# Patient Record
Sex: Female | Born: 1980
Health system: Southern US, Community
[De-identification: ages and names within clinical notes are randomized; demographics above are authoritative.]

## PROBLEM LIST (undated history)

## (undated) DIAGNOSIS — F32A Depression, unspecified: Secondary | ICD-10-CM

## (undated) DIAGNOSIS — F329 Major depressive disorder, single episode, unspecified: Secondary | ICD-10-CM

## (undated) HISTORY — PX: FRACTURE SURGERY: SHX138

## (undated) HISTORY — PX: WISDOM TOOTH EXTRACTION: SHX21

## (undated) HISTORY — DX: Major depressive disorder, single episode, unspecified: F32.9

## (undated) HISTORY — DX: Depression, unspecified: F32.A

---

## 2009-02-03 ENCOUNTER — Inpatient Hospital Stay (HOSPITAL_COMMUNITY): Admission: AD | Admit: 2009-02-03 | Discharge: 2009-02-03 | Payer: Self-pay | Admitting: Obstetrics

## 2009-02-17 ENCOUNTER — Inpatient Hospital Stay (HOSPITAL_COMMUNITY): Admission: AD | Admit: 2009-02-17 | Discharge: 2009-02-19 | Payer: Self-pay | Admitting: Obstetrics

## 2009-12-11 ENCOUNTER — Emergency Department (HOSPITAL_COMMUNITY): Admission: EM | Admit: 2009-12-11 | Discharge: 2009-12-11 | Payer: Self-pay | Admitting: Emergency Medicine

## 2010-08-13 ENCOUNTER — Emergency Department (HOSPITAL_COMMUNITY): Admission: EM | Admit: 2010-08-13 | Discharge: 2010-08-14 | Payer: Self-pay | Admitting: Emergency Medicine

## 2010-08-13 ENCOUNTER — Emergency Department (HOSPITAL_COMMUNITY): Admission: EM | Admit: 2010-08-13 | Discharge: 2010-08-13 | Payer: Self-pay | Admitting: Family Medicine

## 2010-08-17 ENCOUNTER — Emergency Department (HOSPITAL_COMMUNITY): Admission: EM | Admit: 2010-08-17 | Discharge: 2010-08-17 | Payer: Self-pay | Admitting: Family Medicine

## 2010-12-24 NOTE — L&D Delivery Note (Signed)
  Shiffler, MYRKA SYLVA [[id number]]  Delivery Note At 1:28 PM a viable and healthy female was delivered via Vaginal, Spontaneous Delivery (Presentation: ; Occiput Anterior).  APGAR: 9, 9; weight 6 lb 4.9 oz (2860 g).   Placenta status: Intact, Spontaneous.  Cord: 3 vessels  Anesthesia: Epidural  Episiotomy: None Lacerations: None Suture Repair: NA Est. Blood Loss (mL):   Mom to postpartum.  Baby to nursery-stable.  [employer] Rexene Edison 10/25/2011, 1:29 PM     Shiffler, Eulis Foster [[id number]]  Delivery Note At 1:41 PM a viable and healthy female was delivered via Vaginal, Spontaneous Delivery (Presentation: ; Occiput Anterior).  APGAR: 7, 9; weight 5 lb 8.4 oz (2506 g).   Placenta status: Intact, Spontaneous.  Cord: 3 vessels  Anesthesia: Epidural  Episiotomy: None Lacerations: None Suture Repair: NA Est. Blood Loss (mL):   Mom to postpartum.  Baby to nursery-stable.  Siarra Gilkerson H. 10/25/2011, 1:29 PM  Anyi Fels H. 10/25/2011, 1:29 PM

## 2011-04-10 LAB — COMPREHENSIVE METABOLIC PANEL
ALT: 19 U/L (ref 0–35)
AST: 24 U/L (ref 0–37)
Albumin: 2.8 g/dL — ABNORMAL LOW (ref 3.5–5.2)
Alkaline Phosphatase: 116 U/L (ref 39–117)
BUN: 8 mg/dL (ref 6–23)
CO2: 22 mEq/L (ref 19–32)
Calcium: 9 mg/dL (ref 8.4–10.5)
Chloride: 107 mEq/L (ref 96–112)
Creatinine, Ser: 0.47 mg/dL (ref 0.4–1.2)
GFR calc Af Amer: >60 mL/min (ref >60)
GFR calc non Af Amer: >60 mL/min (ref >60)
Glucose, Bld: 89 mg/dL (ref 70–99)
Potassium: 3.8 mEq/L (ref 3.5–5.1)
Sodium: 136 mEq/L (ref 135–145)
Total Bilirubin: 0.3 mg/dL (ref 0.3–1.2)
Total Protein: 5.6 g/dL — ABNORMAL LOW (ref 6.0–8.3)

## 2011-04-10 LAB — CBC
HCT: 34.8 % — ABNORMAL LOW (ref 36.0–46.0)
HCT: 33 % — ABNORMAL LOW (ref 36.0–46.0)
HCT: 35.6 % — ABNORMAL LOW (ref 36.0–46.0)
Hemoglobin: 11.8 g/dL — ABNORMAL LOW (ref 12.0–15.0)
Hemoglobin: 11.1 g/dL — ABNORMAL LOW (ref 12.0–15.0)
Hemoglobin: 11.9 g/dL — ABNORMAL LOW (ref 12.0–15.0)
MCHC: 33.9 g/dL (ref 30.0–36.0)
MCHC: 33.6 g/dL (ref 30.0–36.0)
MCV: 94.8 fL (ref 78.0–100.0)
MCV: 97 fL (ref 78.0–100.0)
Platelets: 174 10*3/uL (ref 150–400)
RBC: 3.66 MIL/uL — ABNORMAL LOW (ref 3.87–5.11)
RBC: 3.4 MIL/uL — ABNORMAL LOW (ref 3.87–5.11)
RBC: 3.71 MIL/uL — ABNORMAL LOW (ref 3.87–5.11)
RDW: 14 % (ref 11.5–15.5)
WBC: 9 10*3/uL (ref 4.0–10.5)
WBC: 14.4 10*3/uL — ABNORMAL HIGH (ref 4.0–10.5)

## 2011-04-10 LAB — URIC ACID: Uric Acid, Serum: 3.9 mg/dL (ref 2.4–7.0)

## 2011-06-11 ENCOUNTER — Institutional Professional Consult (permissible substitution): Payer: Self-pay | Admitting: Pediatrics

## 2011-08-13 LAB — HIV ANTIBODY (ROUTINE TESTING W REFLEX): HIV: NONREACTIVE

## 2011-10-24 ENCOUNTER — Inpatient Hospital Stay (HOSPITAL_COMMUNITY): Payer: Managed Care, Other (non HMO) | Admitting: Anesthesiology

## 2011-10-24 ENCOUNTER — Inpatient Hospital Stay (HOSPITAL_COMMUNITY): Payer: Managed Care, Other (non HMO)

## 2011-10-24 ENCOUNTER — Encounter (HOSPITAL_COMMUNITY): Payer: Self-pay | Admitting: *Deleted

## 2011-10-24 ENCOUNTER — Encounter (HOSPITAL_COMMUNITY): Payer: Self-pay | Admitting: Anesthesiology

## 2011-10-24 ENCOUNTER — Inpatient Hospital Stay (HOSPITAL_COMMUNITY)
Admission: AD | Admit: 2011-10-24 | Discharge: 2011-10-25 | DRG: 775 | Disposition: A | Discharge status: Home / Self Care | Payer: Managed Care, Other (non HMO) | Source: Ambulatory Visit | Attending: Obstetrics & Gynecology | Admitting: Obstetrics & Gynecology

## 2011-10-24 DIAGNOSIS — O30049 Twin pregnancy, dichorionic/diamniotic, unspecified trimester: Secondary | ICD-10-CM

## 2011-10-24 DIAGNOSIS — O30009 Twin pregnancy, unspecified number of placenta and unspecified number of amniotic sacs, unspecified trimester: Principal | ICD-10-CM | POA: Diagnosis present

## 2011-10-24 LAB — SYPHILIS: RPR W/REFLEX TO RPR TITER AND TREPONEMAL ANTIBODIES, TRADITIONAL SCREENING AND DIAGNOSIS ALGORITHM: RPR: NONREACTIVE

## 2011-10-24 LAB — CBC
HCT: 36.8 % (ref 36.0–46.0)
MCH: 29.7 pg (ref 26.0–34.0)
MCHC: 32.9 g/dL (ref 30.0–36.0)
MCV: 90.4 fL (ref 78.0–100.0)
RDW: 16.1 % — ABNORMAL HIGH (ref 11.5–15.5)
WBC: 9 10*3/uL (ref 4.0–10.5)

## 2011-10-24 LAB — ANTIBODY SCREEN: Antibody Screen: NEGATIVE

## 2011-10-24 LAB — RUBELLA ANTIBODY, IGM: Rubella: IMMUNE

## 2011-10-24 LAB — HIV ANTIBODY (ROUTINE TESTING W REFLEX): HIV: NONREACTIVE

## 2011-10-24 LAB — ABO/RH: RH Type: POSITIVE

## 2011-10-24 LAB — STREP B DNA PROBE: GBS: NEGATIVE

## 2011-10-24 MED ORDER — SENNOSIDES-DOCUSATE SODIUM 8.6-50 MG PO TABS
2.0000 | ORAL_TABLET | Freq: Every day | ORAL | Status: DC
  Administered 2011-10-24: 2 via ORAL

## 2011-10-24 MED ORDER — TETANUS-DIPHTH-ACELL PERTUSSIS 5-2.5-18.5 LF-MCG/0.5 IM SUSP: 0.5000 mL | Freq: Once | INTRAMUSCULAR | Status: DC

## 2011-10-24 MED ORDER — DIPHENHYDRAMINE HCL 50 MG/ML IJ SOLN: 12.5000 mg | INTRAMUSCULAR | Status: DC | PRN

## 2011-10-24 MED ORDER — IBUPROFEN 600 MG PO TABS: 600.0000 mg | ORAL_TABLET | Freq: Four times a day (QID) | ORAL | Status: DC

## 2011-10-24 MED ORDER — WITCH HAZEL-GLYCERIN EX PADS: 1.0000 "application " | MEDICATED_PAD | CUTANEOUS | Status: DC | PRN

## 2011-10-24 MED ORDER — LANOLIN HYDROUS EX OINT: TOPICAL_OINTMENT | CUTANEOUS | Status: DC | PRN

## 2011-10-24 MED ORDER — BENZOCAINE-MENTHOL 20-0.5 % EX AERO: 1.0000 "application " | INHALATION_SPRAY | CUTANEOUS | Status: DC | PRN

## 2011-10-24 MED ORDER — FENTANYL 2.5 MCG/ML BUPIVACAINE 1/10 % EPIDURAL INFUSION (WH - ANES)
14.0000 mL/h | INTRAMUSCULAR | Status: DC
  Administered 2011-10-24 (×3): 14 mL/h via EPIDURAL
  Filled 2011-10-24 (×3): qty 60

## 2011-10-24 MED ORDER — ONDANSETRON HCL 4 MG/2ML IJ SOLN: 4.0000 mg | INTRAMUSCULAR | Status: DC | PRN

## 2011-10-24 MED ORDER — ACETAMINOPHEN 325 MG PO TABS: 650.0000 mg | ORAL_TABLET | ORAL | Status: DC | PRN

## 2011-10-24 MED ORDER — TETANUS-DIPHTH-ACELL PERTUSSIS 5-2.5-18.5 LF-MCG/0.5 IM SUSP
0.5000 mL | Freq: Once | INTRAMUSCULAR | Status: AC
  Administered 2011-10-25: 0.5 mL via INTRAMUSCULAR
  Filled 2011-10-24: qty 0.5

## 2011-10-24 MED ORDER — LIDOCAINE HCL (PF) 1 % IJ SOLN
30.0000 mL | INTRAMUSCULAR | Status: DC | PRN
  Filled 2011-10-24 (×2): qty 30

## 2011-10-24 MED ORDER — SENNOSIDES-DOCUSATE SODIUM 8.6-50 MG PO TABS: 2.0000 | ORAL_TABLET | Freq: Every day | ORAL | Status: DC

## 2011-10-24 MED ORDER — DIBUCAINE 1 % RE OINT: 1.0000 "application " | TOPICAL_OINTMENT | RECTAL | Status: DC | PRN

## 2011-10-24 MED ORDER — FLEET ENEMA 7-19 GM/118ML RE ENEM: 1.0000 | ENEMA | RECTAL | Status: DC | PRN

## 2011-10-24 MED ORDER — ONDANSETRON HCL 4 MG PO TABS: 4.0000 mg | ORAL_TABLET | ORAL | Status: DC | PRN

## 2011-10-24 MED ORDER — DIPHENHYDRAMINE HCL 25 MG PO CAPS: 25.0000 mg | ORAL_CAPSULE | Freq: Four times a day (QID) | ORAL | Status: DC | PRN

## 2011-10-24 MED ORDER — OXYCODONE-ACETAMINOPHEN 5-325 MG PO TABS: 1.0000 | ORAL_TABLET | ORAL | Status: DC | PRN

## 2011-10-24 MED ORDER — LACTATED RINGERS IV SOLN
INTRAVENOUS | Status: DC
  Administered 2011-10-24: 04:00:00 via INTRAVENOUS

## 2011-10-24 MED ORDER — ONDANSETRON HCL 4 MG/2ML IJ SOLN: 4.0000 mg | Freq: Four times a day (QID) | INTRAMUSCULAR | Status: DC | PRN

## 2011-10-24 MED ORDER — PRENATAL PLUS 27-1 MG PO TABS
1.0000 | ORAL_TABLET | Freq: Every day | ORAL | Status: DC
  Administered 2011-10-25: 1 via ORAL
  Filled 2011-10-24: qty 1

## 2011-10-24 MED ORDER — OXYTOCIN BOLUS FROM INFUSION
500.0000 mL | Freq: Once | INTRAVENOUS | Status: DC
  Filled 2011-10-24: qty 500

## 2011-10-24 MED ORDER — OXYTOCIN 20 UNITS IN LACTATED RINGERS INFUSION - SIMPLE
1.0000 m[IU]/min | INTRAVENOUS | Status: DC
  Administered 2011-10-24: 2 m[IU]/min via INTRAVENOUS
  Filled 2011-10-24: qty 1000

## 2011-10-24 MED ORDER — PHENYLEPHRINE 40 MCG/ML (10ML) SYRINGE FOR IV PUSH (FOR BLOOD PRESSURE SUPPORT)
80.0000 ug | PREFILLED_SYRINGE | INTRAVENOUS | Status: DC | PRN
  Filled 2011-10-24: qty 5

## 2011-10-24 MED ORDER — IBUPROFEN 600 MG PO TABS
600.0000 mg | ORAL_TABLET | Freq: Four times a day (QID) | ORAL | Status: DC | PRN
  Filled 2011-10-24: qty 1

## 2011-10-24 MED ORDER — PHENYLEPHRINE 40 MCG/ML (10ML) SYRINGE FOR IV PUSH (FOR BLOOD PRESSURE SUPPORT)
80.0000 ug | PREFILLED_SYRINGE | INTRAVENOUS | Status: DC | PRN
  Filled 2011-10-24 (×2): qty 5

## 2011-10-24 MED ORDER — OXYTOCIN 20 UNITS IN LACTATED RINGERS INFUSION - SIMPLE
125.0000 mL/h | Freq: Once | INTRAVENOUS | Status: DC
  Filled 2011-10-24: qty 1000

## 2011-10-24 MED ORDER — OXYCODONE-ACETAMINOPHEN 5-325 MG PO TABS: 2.0000 | ORAL_TABLET | ORAL | Status: DC | PRN

## 2011-10-24 MED ORDER — LIDOCAINE HCL 1.5 % IJ SOLN
INTRAMUSCULAR | Status: DC | PRN
  Administered 2011-10-24: 2 mL via INTRADERMAL
  Administered 2011-10-24 (×2): 5 mL via INTRADERMAL

## 2011-10-24 MED ORDER — PRENATAL PLUS 27-1 MG PO TABS: 1.0000 | ORAL_TABLET | Freq: Every day | ORAL | Status: DC

## 2011-10-24 MED ORDER — ZOLPIDEM TARTRATE 5 MG PO TABS: 5.0000 mg | ORAL_TABLET | Freq: Every evening | ORAL | Status: DC | PRN

## 2011-10-24 MED ORDER — EPHEDRINE 5 MG/ML INJ
10.0000 mg | INTRAVENOUS | Status: AC | PRN
  Administered 2011-10-24 (×2): 10 mg via INTRAVENOUS
  Filled 2011-10-24 (×2): qty 4

## 2011-10-24 MED ORDER — SIMETHICONE 80 MG PO CHEW: 80.0000 mg | CHEWABLE_TABLET | ORAL | Status: DC | PRN

## 2011-10-24 MED ORDER — IBUPROFEN 600 MG PO TABS
600.0000 mg | ORAL_TABLET | Freq: Four times a day (QID) | ORAL | Status: DC
  Administered 2011-10-24 – 2011-10-25 (×4): 600 mg via ORAL
  Filled 2011-10-24 (×4): qty 1

## 2011-10-24 MED ORDER — TERBUTALINE SULFATE 1 MG/ML IJ SOLN: 0.2500 mg | Freq: Once | INTRAMUSCULAR | Status: DC | PRN

## 2011-10-24 MED ORDER — EPHEDRINE 5 MG/ML INJ
10.0000 mg | INTRAVENOUS | Status: DC | PRN
  Filled 2011-10-24: qty 4

## 2011-10-24 MED ORDER — LACTATED RINGERS IV SOLN
500.0000 mL | Freq: Once | INTRAVENOUS | Status: AC
  Administered 2011-10-24: 1000 mL via INTRAVENOUS

## 2011-10-24 MED ORDER — LACTATED RINGERS IV SOLN: 500.0000 mL | INTRAVENOUS | Status: DC | PRN

## 2011-10-24 MED ORDER — CITRIC ACID-SODIUM CITRATE 334-500 MG/5ML PO SOLN: 30.0000 mL | ORAL | Status: DC | PRN

## 2011-10-24 NOTE — H&P (Signed)
30 y.o. G3P2  Estimated Date of Delivery: 11/14/11 admitted at [redacted] weeks gestation with SROM and twin gestation. Prenatal course was uncomplicated.  Patient is a surrogate fertilized with donor embryos. Prenatal labs: Blood Type:A+.  Screening tests for HIV, Syphilis, Hepatitis B, Rubella sensitivity, fetal anomalies, gestational diabetes, and perineal group B strep colonization were negative.    Afebrile, VSS Heart and Lungs: No active disease Abdomen: soft, gravid, EFW AGA twins. Cervical exam:  4-5/80/-2 Grossly ruptured FHT 140-150 x 2 reactive Toco: irregular  Impression: 30 yo G3P2002 gestational surrogate @ 37 weeks with di/di twins SROM Plan: 1) admit  2) presentation vertex/vertex by ultrasound.  PLan for TOL and attempted vaginal delivery.  Will deliver in OR. Pt aware of the possibility of NRFWB of 2nd fetus, compound presentation, cord prolapse, and need for possible breech extraction of 2nd fetus vs emergency cesarean.  R/B/A have been discussed at length.  Pt wishes to proceed with TOL.

## 2011-10-24 NOTE — Progress Notes (Signed)
[place] at bedside for position.

## 2011-10-24 NOTE — Anesthesia Procedure Notes (Signed)
Epidural Patient location during procedure: OB Start time: 10/24/2011 4:16 AM Reason for block: procedure for pain  Staffing Performed by: anesthesiologist   Preanesthetic Checklist Completed: patient identified, site marked, surgical consent, pre-op evaluation, timeout performed, IV checked, risks and benefits discussed and monitors and equipment checked  Epidural Patient position: sitting Prep: site prepped and draped and DuraPrep Patient monitoring: continuous pulse ox and blood pressure Approach: midline Injection technique: LOR air  Needle:  Needle type: Tuohy  Needle gauge: 17 G Needle length: 9 cm Needle insertion depth: 5 cm cm Catheter type: closed end flexible Catheter size: 19 Gauge Catheter at skin depth: 10 cm Test dose: negative  Assessment Events: blood not aspirated, injection not painful, no injection resistance, negative IV test and no paresthesia  Additional Notes Discussed risk of headache, infection, bleeding, nerve injury and failed or incomplete block.  Patient voices understanding and wishes to proceed.

## 2011-10-24 NOTE — Anesthesia Preprocedure Evaluation (Addendum)
Anesthesia Evaluation  Patient identified by MRN, date of birth, ID band Patient awake  General Assessment Comment  Reviewed: Allergy & Precautions, H&P , NPO status , Patient's Chart, lab work & pertinent test results, reviewed documented beta blocker date and time   History of Anesthesia Complications Negative for: history of anesthetic complications  Airway Mallampati: I TM Distance: >3 FB Neck ROM: full    Dental  (+) Teeth Intact   Pulmonary  clear to auscultation        Cardiovascular regular Normal    Neuro/Psych Negative Neurological ROS  Negative Psych ROS   GI/Hepatic negative GI ROS Neg liver ROS    Endo/Other  Negative Endocrine ROS  Renal/GU negative Renal ROS  Genitourinary negative   Musculoskeletal   Abdominal   Peds  Hematology negative hematology ROS (+)   Anesthesia Other Findings   Reproductive/Obstetrics (+) Pregnancy (surrogate - twins vtx/vtx)                          Anesthesia Physical Anesthesia Plan  ASA: II  Anesthesia Plan: Epidural   Post-op Pain Management:    Induction:   Airway Management Planned:   Additional Equipment:   Intra-op Plan:   Post-operative Plan:   Informed Consent: I have reviewed the patients History and Physical, chart, labs and discussed the procedure including the risks, benefits and alternatives for the proposed anesthesia with the patient or authorized representative who has indicated his/her understanding and acceptance.     Plan Discussed with:   Anesthesia Plan Comments:         Anesthesia Quick Evaluation

## 2011-10-24 NOTE — Progress Notes (Signed)
Sw spoke to [employer], Pensions consultant for [employer] [phone]) who advised this Sw, MB director and house coverage  (via phone) that Crystal Gray can visit with the twins and make any decision regarding the twins during this hospitalization.  The biological father is expected to be here on Friday afternoon.  Sw plans to meet with the pt tomorrow and continue working with the couples until the infants are discharged.

## 2011-10-24 NOTE — Progress Notes (Signed)
Dr. Tenny Craw notified of pt presenting with SROM.  Notified of + fern.  Notified of VE and fhr pattern. Order received to admit for labor.  Repeat US for position.  Place epidural.

## 2011-10-24 NOTE — Anesthesia Postprocedure Evaluation (Signed)
Anesthesia Post Note  Patient: Crystal Gray  Procedure(s) Performed: * No procedures listed *  Anesthesia type: Epidural  Patient location: Mother/Baby  Post pain: Pain level controlled  Post assessment: Post-op Vital signs reviewed  Last Vitals:  Filed Vitals:   10/24/11 1451  BP: 132/78  Pulse: 86  Temp:   Resp: 18    Post vital signs: Reviewed  Level of consciousness: awake  Complications: No apparent anesthesia complications

## 2011-10-24 NOTE — Progress Notes (Signed)
Pt G3 P2 at 37wks leaking clear fluid since 0130.  Twins-plans for vag delivery, GBS neg.

## 2011-10-24 NOTE — Progress Notes (Signed)
  S.Pt comfortable after epidural O. Filed Vitals:   10/24/11 0631 10/24/11 0701 10/24/11 0731 10/24/11 0801  BP: 121/77 116/73 96/71 133/82  Pulse: 94 119 128 99  Temp:  98.4 F (36.9 C)    TempSrc:  Oral    Resp: 18 18 18 18   Height:      Weight:      SpO2:       AOX3 NAD Gravid Soft NT Cvx 6-7/80/-2 with bulging bag toco Q 7-10 minutes FHT 140-150 reactive x 2 U/S Vtx/Vtx  A/P 1) 37 week vtx/vtx twin gestation SROM 2) Arom of forebag for clear fluid, Question if Baby B was actually bag that Srom'd 3) Cont epidural 4) Pitocin for augmentation 5) plan for vaginal delivery in OR 6) FWB reassuring

## 2011-10-24 NOTE — Progress Notes (Signed)
Notified of twins with breech presentation for baby b.  Notified of Dr. Tenny Craw specialing  Pt.  Pt may go to room 170.

## 2011-10-25 ENCOUNTER — Encounter (HOSPITAL_COMMUNITY): Payer: Self-pay | Admitting: Obstetrics and Gynecology

## 2011-10-25 LAB — CBC
HCT: 33.5 % — ABNORMAL LOW (ref 36.0–46.0)
Hemoglobin: 10.8 g/dL — ABNORMAL LOW (ref 12.0–15.0)
MCV: 92.3 fL (ref 78.0–100.0)
RBC: 3.63 MIL/uL — ABNORMAL LOW (ref 3.87–5.11)
RDW: 16.5 % — ABNORMAL HIGH (ref 11.5–15.5)
WBC: 9.8 10*3/uL (ref 4.0–10.5)

## 2011-10-25 MED ORDER — HYDROCODONE-ACETAMINOPHEN 5-500 MG PO TABS: 1.0000 | ORAL_TABLET | ORAL | Status: AC | PRN

## 2011-10-25 MED ORDER — ZOLPIDEM TARTRATE 10 MG PO TABS: 10.0000 mg | ORAL_TABLET | Freq: Every evening | ORAL | Status: DC | PRN

## 2011-10-25 MED ORDER — IBUPROFEN 600 MG PO TABS: 600.0000 mg | ORAL_TABLET | Freq: Four times a day (QID) | ORAL | Status: AC | PRN

## 2011-10-25 NOTE — Progress Notes (Signed)
UR Chart review completed.  

## 2011-10-25 NOTE — Addendum Note (Signed)
Addendum  created 10/25/11 0741 by Doreene Burke   Modules edited:Charges VN, Notes Section

## 2011-10-25 NOTE — Anesthesia Postprocedure Evaluation (Signed)
Anesthesia Post Note  Patient: Crystal Gray  Procedure(s) Performed: * No procedures listed *  Anesthesia type: Epidural  Patient location: Mother/Baby  Post pain: Pain level controlled  Post assessment: Post-op Vital signs reviewed  Last Vitals:  Filed Vitals:   10/25/11 0521  BP: 119/72  Pulse: 72  Temp: 36.4 C  Resp: 17    Post vital signs: Reviewed  Level of consciousness: awake  Complications: No apparent anesthesia complications

## 2011-10-25 NOTE — Progress Notes (Signed)
PPD#1 Pt without c/o.  Lochia- mild. Waiting to parents to arrive. May want to go home this PM. IMP/ Doing well Plan/ routine care.

## 2011-10-25 NOTE — Discharge Summary (Signed)
Obstetric Discharge Summary Reason for Admission: onset of labor Prenatal Procedures: ultrasound Intrapartum Procedures: spontaneous vaginal delivery Postpartum Procedures: none Complications-Operative and Postpartum: none Hemoglobin  Date Value Range Status  10/25/2011 10.8* 12.0-15.0 (g/dL) Final     HCT  Date Value Range Status  10/25/2011 33.5* 36.0-46.0 (%) Final    Discharge Diagnoses: Term pregnancy with spontaneous vagianl delivery of twins.  Gestational Surrogate  Discharge Information: Date: 10/25/2011 Activity: pelvic rest Diet: routine Medications: Ibuprofen and Vicodin Condition: improved Instructions: refer to practice specific booklet Discharge to: home Follow-up Information    Follow up with Sabrinna Yearwood H. in 4 weeks. (Follow-up in 4-6 weeks for your post-partum visit)    Contact information:   [address] [place] [address] [phone]          Newborn Data:   Kysha, Muralles [[id number]]  Live born female  Birth Weight: 6 lb 4.9 oz (2860 g) APGAR: 9, 9   Shiffler, KHALEESI GRUEL [[id number]]  Live born female  Birth Weight: 5 lb 8.4 oz (2506 g) APGAR: 7, 9  Home with In newborn nursery waiting for arrival of the biological fathers.  Laniece Hornbaker H. 10/25/2011, 1:32 PM

## 2011-10-25 NOTE — Progress Notes (Signed)
SW contacted Beth Cox/Assistant to Robert Carter/hospital attorney to discuss surrogate situation.  SW faxed her the birth order.  She called back and stated that she would have Mr. Carter review it first thing in the morning and get back to SW. 

## 2011-10-25 NOTE — Progress Notes (Signed)
Robert Carter/hospital attorney contact SW to say that the birth order only addresses the father and we have no documentation stating that the mother is a surrogate and therefore must treat her as the mother of the babies.  He states the hospital must discharge the babies to her if the biological father is not here by discharge.  He states if there is concern by the family he will contact their attorney.   

## 2011-10-26 ENCOUNTER — Encounter (HOSPITAL_COMMUNITY): Payer: Self-pay | Admitting: *Deleted

## 2011-10-26 NOTE — Progress Notes (Signed)
Pt discharged home at 2330.

## 2012-05-07 ENCOUNTER — Ambulatory Visit (INDEPENDENT_AMBULATORY_CARE_PROVIDER_SITE_OTHER): Payer: Managed Care, Other (non HMO) | Admitting: Sports Medicine

## 2012-05-07 VITALS — BP 124/83 | Ht 68.0 in | Wt 145.0 lb

## 2012-05-07 DIAGNOSIS — M84369A Stress fracture, unspecified tibia and fibula, initial encounter for fracture: Secondary | ICD-10-CM | POA: Insufficient documentation

## 2012-05-07 NOTE — Progress Notes (Signed)
  Subjective:    Patient ID: Crystal Gray, female    DOB: [birth year], 31 y.o.   MRN: [id number]  HPI This is a 31 year old new patient presenting with left tibial pain since January 2013.  She gave birth to twins the end of last year and started running again in December. She reports weighing about 10-15 pounds more in December than her baseline weight prior to her pregnancy. In January, during a run, she had a sharp pain in her tibia. She had been running about 12-15 miles a week, her usual distance prior to her pregnancy.  The pain is persistent despite taking a break from running for about 3 months. She presents today due to the persistence of the pain. She has been using ibuprofen infrequently. The pain is worse with weight bearing activity and running is still very painful to her.   Review of Systems    Objective:   Physical Exam Gen: NAD, appears well-nourished, fit Psych: alert and oriented, engaged, appropriate to all questions Pulm: NI WOB MSK:    Hip:     Inspection: normal     Palpation: non-tender     Sensation: intact     Strength: 4/5 abduction of left hip compared to 5/5 on right; patient also feels her left hip is weaker with flexion compared to left but this is not overtly noticeable on PE     ROM: intact   Leg:     Inspection: normal     Palpation: mild TTP left upper tibia     Sensation: intact     Strength: intact knee and foot strength     ROM: intact knee and foot ROM     Maneuvers: positive hop test Neuro:   Gait: favors right side; limps and puts only partial weight on left  Ultrasound Fracture of upper tibia that is healing but showing surface irregularity.  This is marked by hard appearing callus  On long view this is more prominent only on left On transverse view it is scalloped and irregular increased doppler actiivity    Assessment & Plan:

## 2012-05-07 NOTE — Assessment & Plan Note (Addendum)
New problem. Stress fracture of upper tibia that is healing but showing irregular scarring. Air-cast.  Given rehabilitative regimen, which includes avoiding running for next 2 weeks.  Follow-up in 2 weeks for re-evaluation of fracture.  OK to walk or xtrain in air cast We will begin tibial protocol on RTC  Note with higher third of medial tibia this could be slower healing

## 2012-05-27 ENCOUNTER — Ambulatory Visit: Payer: Managed Care, Other (non HMO) | Admitting: Sports Medicine

## 2012-05-27 ENCOUNTER — Ambulatory Visit (INDEPENDENT_AMBULATORY_CARE_PROVIDER_SITE_OTHER): Payer: Managed Care, Other (non HMO) | Admitting: Sports Medicine

## 2012-05-27 VITALS — BP 116/79

## 2012-05-27 DIAGNOSIS — M84369A Stress fracture, unspecified tibia and fibula, initial encounter for fracture: Secondary | ICD-10-CM

## 2012-05-27 NOTE — Progress Notes (Signed)
  Subjective:    Patient ID: Quintella Reichert, female    DOB: 03/18/1981, 31 y.o.   MRN: [id number]  HPI Left Tibial  Fracture followup: Has been using aircast x 2 weeks- without problem.  Has been doing hip exercises as directed for the past 2 weeks.  No pain in area of fracture.  Has not been doing an exercise outside of regular activities of daily living.  No redness or swelling or pain of left lower leg.   No pain with walking or standing during the day Review of Systems As per above.     Objective:   Physical Exam  Constitutional: She appears well-developed and well-nourished.  Cardiovascular: Normal rate.   Pulmonary/Chest: Effort normal. No respiratory distress.  Musculoskeletal:       Left lower leg exam: No redness or swelling of leg or joint.  Normal sensation throughout.  Normal 5/5 strength in lower leg bilateral.   + nickle sized area of callous formation palpable in left medial superior tibial area.  No pain with palpation of this area.    She has a negative hop test She has normal walking gait  MSK ultrasound Compared to prior ultrasound there is increased bone remodeling of the area of callous This looks to be about 95% healed with hard callus and only a very slight increase in Doppler activity       Assessment & Plan:

## 2012-05-27 NOTE — Assessment & Plan Note (Signed)
90% hard callous formation, 10% soft callous formation on u/s.  Healing very well.   Pt to continue to use air cast with activity.  Pt to do return to exercise plan as outlined by Dr. Darrick Penna.  If no problems with pain as pt advances via exercise plan- then pt doesn't need to return.  Pt to return for recheck if any discomfort.

## 2013-01-27 ENCOUNTER — Other Ambulatory Visit: Payer: Self-pay | Admitting: Allergy and Immunology

## 2013-07-12 ENCOUNTER — Encounter (HOSPITAL_COMMUNITY): Payer: Self-pay

## 2013-07-12 ENCOUNTER — Emergency Department (HOSPITAL_COMMUNITY): Payer: Managed Care, Other (non HMO)

## 2013-07-12 ENCOUNTER — Observation Stay (HOSPITAL_COMMUNITY)
Admission: EM | Admit: 2013-07-12 | Discharge: 2013-07-14 | Disposition: A | Discharge status: Home / Self Care | Payer: Managed Care, Other (non HMO) | Attending: Orthopedic Surgery | Admitting: Orthopedic Surgery

## 2013-07-12 DIAGNOSIS — W19XXXA Unspecified fall, initial encounter: Secondary | ICD-10-CM | POA: Insufficient documentation

## 2013-07-12 DIAGNOSIS — S82209A Unspecified fracture of shaft of unspecified tibia, initial encounter for closed fracture: Principal | ICD-10-CM | POA: Insufficient documentation

## 2013-07-12 DIAGNOSIS — Y9351 Activity, roller skating (inline) and skateboarding: Secondary | ICD-10-CM | POA: Insufficient documentation

## 2013-07-12 DIAGNOSIS — S82202A Unspecified fracture of shaft of left tibia, initial encounter for closed fracture: Secondary | ICD-10-CM

## 2013-07-12 DIAGNOSIS — S82409A Unspecified fracture of shaft of unspecified fibula, initial encounter for closed fracture: Principal | ICD-10-CM | POA: Insufficient documentation

## 2013-07-12 MED ORDER — MORPHINE SULFATE 4 MG/ML IJ SOLN
4.0000 mg | Freq: Once | INTRAMUSCULAR | Status: AC
  Administered 2013-07-12: 4 mg via INTRAVENOUS
  Filled 2013-07-12: qty 1

## 2013-07-12 NOTE — ED Provider Notes (Signed)
History    CSN: [id number] Arrival date & time 07/12/13  1928  First MD Initiated Contact with Patient 07/12/13 1943     Chief Complaint  Patient presents with  . Leg Injury   (Consider location/radiation/quality/duration/timing/severity/associated sxs/prior Treatment) Patient is a 32 y.o. female presenting with leg pain.  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   Impact surface:  Athletic surface Leg location:  L lower leg Pain details:    Quality:  Sharp   Severity:  Severe   Onset quality:  Sudden   Timing:  Constant Relieved by: IV fentanyl given by EMS. Exacerbated by: movement. Associated symptoms: no numbness and no tingling    Past Medical History  Diagnosis Date  . No pertinent past medical history    Past Surgical History  Procedure Laterality Date  . Wisdom tooth extraction     No family history on file. History  Substance Use Topics  . Smoking status: Never Smoker   . Smokeless tobacco: Not on file  . Alcohol Use: No   OB History   Grav Para Term Preterm Abortions TAB SAB Ect Mult Living   4 3 3  0 0 0 0 0 1 4     Review of Systems  Neurological: Negative for weakness and numbness.  All other systems reviewed and are negative.    Allergies  Review of patient's allergies indicates no known allergies.  Home Medications   Current Outpatient Rx  Name  Route  Sig  Dispense  Refill  . norgestimate-ethinyl estradiol (ORTHO-CYCLEN,SPRINTEC,PREVIFEM) 0.25-35 MG-MCG tablet   Oral   Take 1 tablet by mouth daily.         Marland Kitchen zolpidem (AMBIEN) 10 MG tablet   Oral   Take 10 mg by mouth at bedtime as needed.            BP 131/80  Temp(Src) 98.1 F (36.7 C) (Oral)  Resp 18  SpO2 100%  LMP 06/29/2013 Physical Exam  Nursing note and vitals reviewed. Constitutional: She is oriented to person, place, and time. She appears well-developed  and well-nourished. No distress.  HENT:  Head: Normocephalic and atraumatic. Head is without raccoon's eyes and without Battle's sign.  Nose: Nose normal.  Mouth/Throat: Oropharynx is clear and moist.  Eyes: Conjunctivae and EOM are normal. Pupils are equal, round, and reactive to light. No scleral icterus.  Neck: Neck supple. No spinous process tenderness and no muscular tenderness present.  Cardiovascular: Normal rate, regular rhythm, normal heart sounds and intact distal pulses.   No murmur heard. Pulmonary/Chest: Effort normal and breath sounds normal. No stridor. No respiratory distress. She has no rales. She exhibits no tenderness.  Abdominal: Soft. Bowel sounds are normal. She exhibits no distension. There is no tenderness. There is no rebound and no guarding.  Musculoskeletal: Normal range of motion. She exhibits no edema and no tenderness.       Thoracic back: She exhibits no tenderness and no bony tenderness.       Lumbar back: She exhibits no tenderness and no bony tenderness.       Left lower leg: She exhibits deformity ( 2+ distal pulses, normal sensation and perfusion. motor intact. ). She exhibits no laceration.  No evidence of trauma to extremities, except as noted.  2+ distal pulses.    Neurological: She is alert and oriented to person,  place, and time.  Skin: Skin is warm and dry. No rash noted.  Psychiatric: She has a normal mood and affect. Her behavior is normal.    ED Course  Procedures (including critical care time) Labs Reviewed - No data to display Dg Tibia/fibula Left  07/12/2013   *RADIOLOGY REPORT*  Clinical Data: Lower leg injury.  LEFT TIBIA AND FIBULA - 2 VIEW  Comparison: None.  Findings: A comminuted fracture of the distal tibia and fibula is displaced laterally one shaft width.  The ankle and knee joints are located.  IMPRESSION: Comminuted distal tibia and fibula fracture with lateral displacement as described.   Original Report Authenticated By:  Marin Roberts, M.D.  All radiology studies independently viewed by me.    1. Fracture tibia/fibula, left, closed, initial encounter     MDM  32 yo female with obvious deformity to left lower leg.  Not open.  No other injuries identified on history or exam.  IV morphine for pain.  Plain films pending.    Confirm Tib/fib fracture.  Ortho consulted and has admitted for surgical fixation.  IV morphine for pain with transient improvement.    Candyce Churn, MD 07/13/13 [phone]

## 2013-07-12 NOTE — Consult Note (Signed)
Reason for Consult:left leg pain, deformity Referring Physician: [facility]  Crystal Gray is an 32 y.o. female who fell while playing roller derby earlier today. She can not relate the specifics of the injury, but did fall and was unable to bear weight and noted obvious deformity of left leg. Pain was severe, but has decreased. Denies any other substantial pain. Pain is a throbbing sensation. Pain is located at the left lower leg.   Past Medical History  Diagnosis Date  . No pertinent past medical history     Past Surgical History  Procedure Laterality Date  . Wisdom tooth extraction      No family history on file.  Social History:  reports that she has never smoked. She does not have any smokeless tobacco history on file. She reports that she does not drink alcohol or use illicit drugs.  Allergies: No Known Allergies  Medications: I have reviewed the patient's current medications.  No results found for this or any previous visit (from the past 48 hour(s)).  Dg Tibia/fibula Left  07/12/2013   *RADIOLOGY REPORT*  Clinical Data: Lower leg injury.  LEFT TIBIA AND FIBULA - 2 VIEW  Comparison: None.  Findings: A comminuted fracture of the distal tibia and fibula is displaced laterally one shaft width.  The ankle and knee joints are located.  IMPRESSION: Comminuted distal tibia and fibula fracture with lateral displacement as described.   Original Report Authenticated By: Marin Roberts, M.D.    Review of Systems  Constitutional: Negative.   HENT: Negative.   Eyes: Negative.   Respiratory: Negative.   Cardiovascular: Negative.   Gastrointestinal: Negative.   Genitourinary: Negative.   Skin: Negative.   All other systems reviewed and are negative.   Blood pressure 126/74, pulse 85, temperature 98.1 F (36.7 C), temperature source Oral, resp. rate 18, last menstrual period 06/29/2013, SpO2 99.00%. Physical Exam  Constitutional: She appears well-developed and  well-nourished.  HENT:  Head: Normocephalic.  Eyes: Pupils are equal, round, and reactive to light.  Neck: Normal range of motion.  Cardiovascular: Intact distal pulses.   Respiratory: Effort normal.  GI: Soft.  Neurological: She is alert.  Skin: Skin is warm and dry.   Patient is noted to have obvious external rotation deformity of left lower leg.  + DP pulses, + sensation distally Very slight abrasion over left leg + FHL, EHL, with pain  Assessment/Plan: Left tibia fracture  I discussed patient with Dr. Turner Daniels. Plan is to admit to the floor in a post mold splint.  Patient will be made NPO after midnight tonight, and plan to to proceed to OR tomorrow for operative fixation of the fracture with an IM nail. Procedure and risks, etc. discussed with patient. Dr. Turner Daniels will discuss in more detail with patient tomorrow.     Vikrant Pryce LEONARD 07/12/2013, 11:37 PM

## 2013-07-12 NOTE — Progress Notes (Signed)
Orthopedic Tech Progress Note Patient Details:  Crystal Gray 11-Nov-1981 [id number]  Ortho Devices Type of Ortho Device: Ace wrap;Post (short leg) splint Ortho Device/Splint Location: left leg Ortho Device/Splint Interventions: Application   Jusitn Salsgiver 07/12/2013, 10:45 PM

## 2013-07-12 NOTE — ED Notes (Signed)
Dr. Yevette Edwards called to verify that patient can eat, due to surgery being in the am.

## 2013-07-12 NOTE — ED Notes (Signed)
Paged ortho to apply left post mold splint.

## 2013-07-12 NOTE — ED Notes (Signed)
Patient presents to ED via [facility]. Patient was playing roller derby and fell. Notable deformity to left leg. No open fracture noted. Positive pulses, motor and sensory. Denies hitting head. Denies LOC.

## 2013-07-12 NOTE — ED Notes (Signed)
Patient in xray at this time.

## 2013-07-13 ENCOUNTER — Encounter (HOSPITAL_COMMUNITY): Payer: Self-pay | Admitting: Anesthesiology

## 2013-07-13 ENCOUNTER — Encounter (HOSPITAL_COMMUNITY)
Admission: EM | Disposition: A | Discharge status: Home / Self Care | Payer: Self-pay | Source: Home / Self Care | Attending: Orthopedic Surgery

## 2013-07-13 ENCOUNTER — Inpatient Hospital Stay (HOSPITAL_COMMUNITY): Payer: Managed Care, Other (non HMO) | Admitting: Anesthesiology

## 2013-07-13 ENCOUNTER — Inpatient Hospital Stay (HOSPITAL_COMMUNITY): Payer: Managed Care, Other (non HMO)

## 2013-07-13 DIAGNOSIS — S82202A Unspecified fracture of shaft of left tibia, initial encounter for closed fracture: Secondary | ICD-10-CM

## 2013-07-13 HISTORY — PX: TIBIA IM NAIL INSERTION: SHX2516

## 2013-07-13 LAB — URINALYSIS, ROUTINE W REFLEX MICROSCOPIC
Hgb urine dipstick: NEGATIVE
Leukocytes, UA: NEGATIVE
Nitrite: NEGATIVE
Specific Gravity, Urine: 1.012 (ref 1.005–1.030)
Urobilinogen, UA: 0.2 mg/dL (ref 0.0–1.0)

## 2013-07-13 LAB — CBC
HCT: 35.5 % — ABNORMAL LOW (ref 36.0–46.0)
Hemoglobin: 11.8 g/dL — ABNORMAL LOW (ref 12.0–15.0)
MCHC: 33.2 g/dL (ref 30.0–36.0)

## 2013-07-13 LAB — CREATININE, SERUM
GFR calc Af Amer: >90 mL/min (ref >90)
GFR calc non Af Amer: >90 mL/min (ref >90)

## 2013-07-13 SURGERY — INSERTION, INTRAMEDULLARY ROD, TIBIA
Anesthesia: General | Site: Leg Lower | Laterality: Left | Wound class: Clean

## 2013-07-13 MED ORDER — METHOCARBAMOL 500 MG PO TABS
500.0000 mg | ORAL_TABLET | Freq: Four times a day (QID) | ORAL | Status: DC | PRN
  Administered 2013-07-13 – 2013-07-14 (×2): 500 mg via ORAL
  Filled 2013-07-13 (×2): qty 1

## 2013-07-13 MED ORDER — ONDANSETRON HCL 4 MG/2ML IJ SOLN: 4.0000 mg | Freq: Four times a day (QID) | INTRAMUSCULAR | Status: DC | PRN

## 2013-07-13 MED ORDER — KCL IN DEXTROSE-NACL 20-5-0.45 MEQ/L-%-% IV SOLN
INTRAVENOUS | Status: DC
  Filled 2013-07-13 (×4): qty 1000

## 2013-07-13 MED ORDER — METHOCARBAMOL 100 MG/ML IJ SOLN
500.0000 mg | Freq: Four times a day (QID) | INTRAVENOUS | Status: DC | PRN
  Filled 2013-07-13: qty 5

## 2013-07-13 MED ORDER — ACETAMINOPHEN 325 MG PO TABS: 650.0000 mg | ORAL_TABLET | ORAL | Status: DC | PRN

## 2013-07-13 MED ORDER — BUPIVACAINE HCL (PF) 0.25 % IJ SOLN
INTRAMUSCULAR | Status: AC
  Filled 2013-07-13: qty 30

## 2013-07-13 MED ORDER — PROMETHAZINE HCL 25 MG/ML IJ SOLN: 6.2500 mg | INTRAMUSCULAR | Status: DC | PRN

## 2013-07-13 MED ORDER — HYDROCODONE-ACETAMINOPHEN 5-325 MG PO TABS: 1.0000 | ORAL_TABLET | ORAL | Status: DC | PRN

## 2013-07-13 MED ORDER — ONDANSETRON HCL 4 MG PO TABS: 4.0000 mg | ORAL_TABLET | Freq: Four times a day (QID) | ORAL | Status: DC | PRN

## 2013-07-13 MED ORDER — ZOLPIDEM TARTRATE 5 MG PO TABS: 5.0000 mg | ORAL_TABLET | Freq: Every evening | ORAL | Status: DC | PRN

## 2013-07-13 MED ORDER — METOCLOPRAMIDE HCL 5 MG PO TABS
5.0000 mg | ORAL_TABLET | Freq: Three times a day (TID) | ORAL | Status: DC | PRN
  Filled 2013-07-13: qty 2

## 2013-07-13 MED ORDER — DIPHENHYDRAMINE HCL 12.5 MG/5ML PO ELIX: 12.5000 mg | ORAL_SOLUTION | ORAL | Status: DC | PRN

## 2013-07-13 MED ORDER — DIAZEPAM 5 MG PO TABS
5.0000 mg | ORAL_TABLET | Freq: Four times a day (QID) | ORAL | Status: DC | PRN
  Administered 2013-07-13: 5 mg via ORAL
  Filled 2013-07-13: qty 1

## 2013-07-13 MED ORDER — NALOXONE HCL 0.4 MG/ML IJ SOLN: 0.4000 mg | INTRAMUSCULAR | Status: DC | PRN

## 2013-07-13 MED ORDER — ENOXAPARIN SODIUM 30 MG/0.3ML ~~LOC~~ SOLN
30.0000 mg | SUBCUTANEOUS | Status: DC
  Administered 2013-07-14: 30 mg via SUBCUTANEOUS
  Filled 2013-07-13 (×2): qty 0.3

## 2013-07-13 MED ORDER — HYDROMORPHONE HCL PF 1 MG/ML IJ SOLN
INTRAMUSCULAR | Status: AC
  Filled 2013-07-13: qty 1

## 2013-07-13 MED ORDER — SODIUM CHLORIDE 0.9 % IV SOLN
250.0000 mL | INTRAVENOUS | Status: DC
  Administered 2013-07-13 (×2): 250 mL via INTRAVENOUS

## 2013-07-13 MED ORDER — MORPHINE SULFATE (PF) 1 MG/ML IV SOLN
INTRAVENOUS | Status: DC
  Administered 2013-07-13: 3 mg via INTRAVENOUS
  Administered 2013-07-13: 6 mg via INTRAVENOUS
  Administered 2013-07-13: 02:00:00 via INTRAVENOUS
  Filled 2013-07-13: qty 25

## 2013-07-13 MED ORDER — MORPHINE SULFATE 4 MG/ML IJ SOLN
4.0000 mg | Freq: Once | INTRAMUSCULAR | Status: AC
  Administered 2013-07-13: 4 mg via INTRAVENOUS
  Filled 2013-07-13: qty 1

## 2013-07-13 MED ORDER — SODIUM CHLORIDE 0.9 % IJ SOLN: 9.0000 mL | INTRAMUSCULAR | Status: DC | PRN

## 2013-07-13 MED ORDER — 0.9 % SODIUM CHLORIDE (POUR BTL) OPTIME
TOPICAL | Status: DC | PRN
  Administered 2013-07-13: 1000 mL

## 2013-07-13 MED ORDER — DIPHENHYDRAMINE HCL 50 MG/ML IJ SOLN: 12.5000 mg | Freq: Four times a day (QID) | INTRAMUSCULAR | Status: DC | PRN

## 2013-07-13 MED ORDER — ALUM & MAG HYDROXIDE-SIMETH 200-200-20 MG/5ML PO SUSP: 30.0000 mL | Freq: Four times a day (QID) | ORAL | Status: DC | PRN

## 2013-07-13 MED ORDER — METOCLOPRAMIDE HCL 5 MG/ML IJ SOLN: 5.0000 mg | Freq: Three times a day (TID) | INTRAMUSCULAR | Status: DC | PRN

## 2013-07-13 MED ORDER — HYDROMORPHONE HCL PF 1 MG/ML IJ SOLN
0.2500 mg | INTRAMUSCULAR | Status: DC | PRN
  Administered 2013-07-13 (×2): 0.5 mg via INTRAVENOUS

## 2013-07-13 MED ORDER — OXYCODONE HCL 5 MG PO TABS: 5.0000 mg | ORAL_TABLET | Freq: Once | ORAL | Status: DC | PRN

## 2013-07-13 MED ORDER — HYDROMORPHONE HCL PF 1 MG/ML IJ SOLN
0.5000 mg | INTRAMUSCULAR | Status: DC | PRN
  Administered 2013-07-13: 1 mg via INTRAVENOUS
  Administered 2013-07-13: 0.5 mg via INTRAVENOUS
  Filled 2013-07-13 (×3): qty 1

## 2013-07-13 MED ORDER — CEFAZOLIN SODIUM-DEXTROSE 2-3 GM-% IV SOLR
INTRAVENOUS | Status: AC
  Filled 2013-07-13: qty 50

## 2013-07-13 MED ORDER — ACETAMINOPHEN 650 MG RE SUPP: 650.0000 mg | RECTAL | Status: DC | PRN

## 2013-07-13 MED ORDER — BUPIVACAINE HCL (PF) 0.25 % IJ SOLN
INTRAMUSCULAR | Status: DC | PRN
  Administered 2013-07-13: 10 mL

## 2013-07-13 MED ORDER — OXYCODONE-ACETAMINOPHEN 5-325 MG PO TABS: 1.0000 | ORAL_TABLET | ORAL | Status: DC | PRN

## 2013-07-13 MED ORDER — SODIUM CHLORIDE 0.9 % IJ SOLN: 3.0000 mL | INTRAMUSCULAR | Status: DC | PRN

## 2013-07-13 MED ORDER — SODIUM CHLORIDE 0.9 % IJ SOLN: 3.0000 mL | Freq: Two times a day (BID) | INTRAMUSCULAR | Status: DC

## 2013-07-13 MED ORDER — ONDANSETRON HCL 4 MG/2ML IJ SOLN: 4.0000 mg | INTRAMUSCULAR | Status: DC | PRN

## 2013-07-13 MED ORDER — MORPHINE SULFATE 2 MG/ML IJ SOLN: 1.0000 mg | INTRAMUSCULAR | Status: DC | PRN

## 2013-07-13 MED ORDER — OXYCODONE HCL 5 MG/5ML PO SOLN: 5.0000 mg | Freq: Once | ORAL | Status: DC | PRN

## 2013-07-13 MED ORDER — HYDROMORPHONE HCL PF 1 MG/ML IJ SOLN
INTRAMUSCULAR | Status: AC
  Administered 2013-07-13: 0.5 mg via INTRAVENOUS
  Filled 2013-07-13: qty 1

## 2013-07-13 MED ORDER — OXYCODONE-ACETAMINOPHEN 5-325 MG PO TABS
1.0000 | ORAL_TABLET | ORAL | Status: DC | PRN
  Administered 2013-07-13 – 2013-07-14 (×3): 2 via ORAL
  Filled 2013-07-13 (×3): qty 2

## 2013-07-13 MED ORDER — DIPHENHYDRAMINE HCL 12.5 MG/5ML PO ELIX: 12.5000 mg | ORAL_SOLUTION | Freq: Four times a day (QID) | ORAL | Status: DC | PRN

## 2013-07-13 MED ORDER — DOCUSATE SODIUM 100 MG PO CAPS
100.0000 mg | ORAL_CAPSULE | Freq: Two times a day (BID) | ORAL | Status: DC
  Administered 2013-07-14: 100 mg via ORAL
  Filled 2013-07-13 (×3): qty 1

## 2013-07-13 SURGICAL SUPPLY — 64 items
BANDAGE ADHESIVE 1X3 (GAUZE/BANDAGES/DRESSINGS) ×4 IMPLANT
BANDAGE ELASTIC 6 VELCRO ST LF (GAUZE/BANDAGES/DRESSINGS) ×4 IMPLANT
BANDAGE GAUZE ELAST BULKY 4 IN (GAUZE/BANDAGES/DRESSINGS) ×2 IMPLANT
BIT DRILL 3.8X6 NS (BIT) ×2 IMPLANT
BIT DRILL 4.4 NS (BIT) ×2 IMPLANT
BLADE SURG 15 STRL LF DISP TIS (BLADE) IMPLANT
BLADE SURG 15 STRL SS (BLADE)
BLADE SURG ROTATE 9660 (MISCELLANEOUS) IMPLANT
CLOTH BEACON ORANGE TIMEOUT ST (SAFETY) ×2 IMPLANT
COVER SURGICAL LIGHT HANDLE (MISCELLANEOUS) ×2 IMPLANT
CUFF TOURNIQUET SINGLE 34IN LL (TOURNIQUET CUFF) ×2 IMPLANT
CUFF TOURNIQUET SINGLE 44IN (TOURNIQUET CUFF) IMPLANT
DRAPE C-ARM 42X72 X-RAY (DRAPES) ×2 IMPLANT
DRAPE C-ARMOR (DRAPES) ×2 IMPLANT
DRAPE ORTHO SPLIT 77X108 STRL (DRAPES) ×3
DRAPE PROXIMA HALF (DRAPES) ×2 IMPLANT
DRAPE SURG ORHT 6 SPLT 77X108 (DRAPES) ×3 IMPLANT
DRSG ADAPTIC 3X8 NADH LF (GAUZE/BANDAGES/DRESSINGS) IMPLANT
DURAPREP 26ML APPLICATOR (WOUND CARE) ×2 IMPLANT
ELECT REM PT RETURN 9FT ADLT (ELECTROSURGICAL) ×2
ELECTRODE REM PT RTRN 9FT ADLT (ELECTROSURGICAL) ×1 IMPLANT
GAUZE XEROFORM 1X8 LF (GAUZE/BANDAGES/DRESSINGS) ×2 IMPLANT
GLOVE BIO SURGEON STRL SZ7 (GLOVE) ×2 IMPLANT
GLOVE BIO SURGEON STRL SZ7.5 (GLOVE) ×8 IMPLANT
GLOVE BIO SURGEON STRL SZ8 (GLOVE) ×2 IMPLANT
GLOVE BIO SURGEON STRL SZ8.5 (GLOVE) ×2 IMPLANT
GLOVE BIOGEL PI IND STRL 6.5 (GLOVE) ×1 IMPLANT
GLOVE BIOGEL PI IND STRL 7.0 (GLOVE) ×1 IMPLANT
GLOVE BIOGEL PI IND STRL 8 (GLOVE) ×2 IMPLANT
GLOVE BIOGEL PI INDICATOR 6.5 (GLOVE) ×1
GLOVE BIOGEL PI INDICATOR 7.0 (GLOVE) ×1
GLOVE BIOGEL PI INDICATOR 8 (GLOVE) ×2
GLOVE ECLIPSE 6.5 STRL STRAW (GLOVE) ×2 IMPLANT
GLOVE NEODERM STER SZ 7 (GLOVE) ×4 IMPLANT
GOWN SRG XL XLNG 56XLVL 4 (GOWN DISPOSABLE) ×2 IMPLANT
GOWN STRL NON-REIN LRG LVL3 (GOWN DISPOSABLE) ×6 IMPLANT
GOWN STRL NON-REIN XL XLG LVL4 (GOWN DISPOSABLE) ×2
GUIDEWIRE BALL NOSE 80CM (WIRE) ×2 IMPLANT
KIT BASIN OR (CUSTOM PROCEDURE TRAY) ×2 IMPLANT
KIT ROOM TURNOVER OR (KITS) ×2 IMPLANT
MANIFOLD NEPTUNE II (INSTRUMENTS) ×2 IMPLANT
NAIL TIBIA 8X33CM (Nail) ×2 IMPLANT
NEEDLE 22X1 1/2 (OR ONLY) (NEEDLE) ×2 IMPLANT
NS IRRIG 1000ML POUR BTL (IV SOLUTION) ×2 IMPLANT
PACK GENERAL/GYN (CUSTOM PROCEDURE TRAY) ×2 IMPLANT
PAD ARMBOARD 7.5X6 YLW CONV (MISCELLANEOUS) ×4 IMPLANT
PADDING CAST COTTON 6X4 STRL (CAST SUPPLIES) IMPLANT
PIN GUIDE ACE (PIN) ×2 IMPLANT
SCREW ACECAP 32MM (Screw) ×2 IMPLANT
SCREW ACECAP 36MM (Screw) ×2 IMPLANT
SCREW PROXIMAL DEPUY (Screw) ×2 IMPLANT
SCREW PRXML FT 40X5.5XNS LF (Screw) ×1 IMPLANT
SCREW PRXML FT 45X5.5XLCK NS (Screw) ×1 IMPLANT
SPONGE GAUZE 4X4 12PLY (GAUZE/BANDAGES/DRESSINGS) ×2 IMPLANT
STAPLER VISISTAT 35W (STAPLE) IMPLANT
STOCKINETTE 6  STRL (DRAPES)
STOCKINETTE 6 STRL (DRAPES) IMPLANT
SUT ETHILON 3 0 PS 1 (SUTURE) ×4 IMPLANT
SUT VIC AB 0 CTB1 27 (SUTURE) ×2 IMPLANT
SUT VIC AB 2-0 CTB1 (SUTURE) ×2 IMPLANT
SYR 20ML ECCENTRIC (SYRINGE) ×2 IMPLANT
TOWEL OR 17X24 6PK STRL BLUE (TOWEL DISPOSABLE) ×2 IMPLANT
TOWEL OR 17X26 10 PK STRL BLUE (TOWEL DISPOSABLE) ×2 IMPLANT
WATER STERILE IRR 1000ML POUR (IV SOLUTION) ×2 IMPLANT

## 2013-07-13 NOTE — Anesthesia Preprocedure Evaluation (Addendum)
Anesthesia Evaluation  Patient identified by MRN, date of birth, ID band  Reviewed: Allergy & Precautions, H&P , NPO status , Patient's Chart, lab work & pertinent test results  History of Anesthesia Complications (+) PONV  Airway Mallampati: I TM Distance: >3 FB Neck ROM: full    Dental  (+) Teeth Intact and Dental Advidsory Given   Pulmonary neg pulmonary ROS,          Cardiovascular negative cardio ROS      Neuro/Psych negative neurological ROS     GI/Hepatic negative GI ROS, Neg liver ROS,   Endo/Other  negative endocrine ROS  Renal/GU negative Renal ROS     Musculoskeletal negative musculoskeletal ROS (+)   Abdominal   Peds  Hematology negative hematology ROS (+)   Anesthesia Other Findings   Reproductive/Obstetrics                          Anesthesia Physical Anesthesia Plan  ASA: I  Anesthesia Plan: General   Post-op Pain Management:    Induction: Intravenous  Airway Management Planned: LMA  Additional Equipment:   Intra-op Plan:   Post-operative Plan:   Informed Consent:   Dental Advisory Given  Plan Discussed with: CRNA, Surgeon and Anesthesiologist  Anesthesia Plan Comments:        Anesthesia Quick Evaluation

## 2013-07-13 NOTE — Op Note (Signed)
Pre Op Dx: Left distal tibia and fibula fractures at the junction of middle and distal thirds closed  Post Op Dx: Same  Procedure: Open reduction internal fixation left tibia fracture using 8 mm x 33 cm Biomet tibial nail 2 proximal locking screws and 2 distal locking screws  Surgeon: Nestor Lewandowsky, MD  Assistant: Tomi Likens. Vear Clock PA-C  Anesthesia: General  EBL: Minimal  Fluids: 1200 cc crystalloid  Tourniquet Time: None  Indications: 32 year old who was engaged in the roller derby yesterday went down and sustained closed spiral left distal tib-fib fractures at the junction of middle and distal thirds. Was admitted by my partner Dr. Yevette Edwards for operative intervention today. Plan is for intramedullary rodding of the tibia to stabilize the fracture decrease pain and increase potential of healing. Risks and benefits of surgery discussed with the patient.  Procedure: Patient identified by arm band and taken to operating room for the appropriate anesthetic monitors were attached and general endotracheal anesthesia induced with the patient in the supine position on the flat [place] table. She did receive preoperative IV antibiotics. Tourniquet was applied to the distal left thigh but never used. Left lower Charney prepped and draped in usual sterile fashion from the toes to the tourniquet. Timeout procedure performed. The knee was then placed over a radiolucent triangle and a medial parapatellar incision from the tip of the patella to the shoulder of the tibia was made through the skin and subcutaneous tissue. Using a #10 blade we then split the fascia between the patellar tendon and retinaculum medially and entered the proximal tibia with a Biomet all. This is followed by the guidewire which was then threaded through the diaphysis of the tibia across the fracture site and into the center of the ankle. C-arm imaging was used to confirm position of the guidewire which was then overreamed with a  proximal reamer and a flexible reamer starting out at 8 mm and going to 9.5 mm. She had a very tight isthmus. We then measured for a 33 cm x 8 mm tibial nail which was loaded on the inserter and driven over the guidewire C-arm imaging was used to confirm good position of the nail which was then locked proximally with medial and lateral oblique screws and then distally using the perfect circle technique with the C-arm. Distally we used an anterior to posterior screw and a medial to lateral screw. Final C-arm images were taken confirming good position of the nail and screws. The stab wounds for the screw insertion and the main incision were irrigated with normal saline solution. The medial parapatellar wound was closed with running 2-0 Vicryl suture and the stab wounds closed with 3-0 nylon suture. Dressing of Xerofoam 4 x 4 dressing sponges web roll and a six-inch Ace wrap was applied. The patient was awakened extubated and taken to the recovery without difficulty.

## 2013-07-13 NOTE — Progress Notes (Signed)
ANTICOAGULATION CONSULT NOTE - Initial Consult  Pharmacy Consult for Coumadin Indication: VTE prophylaxis  No Known Allergies  Vital Signs: Temp: 98.5 F (36.9 C) (07/21 1531) Temp src: Oral (07/21 0657) BP: 143/84 mmHg (07/21 1531) Pulse Rate: 80 (07/21 1531)  Labs: No results found for this basename: HGB, HCT, PLT, APTT, LABPROT, INR, HEPARINUNFRC, CREATININE, CKTOTAL, CKMB, TROPONINI,  in the last 72 hours  Estimated Creatinine Clearance: 102.8 ml/min (by C-G formula based on Cr of 0.47).   Medical History: Past Medical History  Diagnosis Date  . No pertinent past medical history     Assessment: 32 year old female in a recreational roller derby accident with left tibia and fibula fracture.  S/p repair 7/21, now beginning Coumadin for VTE prophylaxis  Goal of Therapy:  INR 2-3 Monitor platelets by anticoagulation protocol: Yes   Plan:  1) Coumadin 5 mg po daily at 1800 pm 2) Daily INR 3) Coumadin education  Thank you. Okey Regal, PharmD [phone]  07/13/2013,4:01 PM

## 2013-07-13 NOTE — Progress Notes (Signed)
UR COMPLETED  

## 2013-07-13 NOTE — Anesthesia Postprocedure Evaluation (Signed)
Anesthesia Post Note  Patient: Crystal Gray  Procedure(s) Performed: Procedure(s) (LRB): INTRAMEDULLARY (IM) NAIL TIBIAL (Left)  Anesthesia type: general  Patient location: PACU  Post pain: Pain level controlled  Post assessment: Patient's Cardiovascular Status Stable  Last Vitals:  Filed Vitals:   07/13/13 1500  BP:   Pulse: 63  Temp:   Resp: 11    Post vital signs: Reviewed and stable  Level of consciousness: sedated  Complications: No apparent anesthesia complications

## 2013-07-13 NOTE — Transfer of Care (Signed)
Immediate Anesthesia Transfer of Care Note  Patient: Pension scheme manager  Procedure(s) Performed: Procedure(s): INTRAMEDULLARY (IM) NAIL TIBIAL (Left)  Patient Location: PACU  Anesthesia Type:General  Level of Consciousness: awake, alert  and oriented  Airway & Oxygen Therapy: Patient Spontanous Breathing and Patient connected to nasal cannula oxygen  Post-op Assessment: Report given to PACU RN, Post -op Vital signs reviewed and stable and Patient moving all extremities X 4  Post vital signs: Reviewed and stable  Complications: No apparent anesthesia complications

## 2013-07-13 NOTE — H&P (Signed)
Crystal Gray is an 32 y.o. female.   Chief Complaint: Left tibia and fibula fracture at the junction of the middle and distal thirds closed mildly comminuted HPI: Patient was participating in recreational league roller derby was knocked down during the event and sustained a closed spiral mildly comminuted and displaced left distal tib-fib fracture the junction of middle and distal third. 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 She was admitted last evening by my partner Dr. Yevette Edwards, and I will be assuming her care today for surgical stabilization of the fractures. She denies any loss of consciousness or injuries to other extremities.  Past Medical History  Diagnosis Date  . No pertinent past medical history     Past Surgical History  Procedure Laterality Date  . Wisdom tooth extraction      No family history on file. Social History:  reports that she has never smoked. She does not have any smokeless tobacco history on file. She reports that she does not drink alcohol or use illicit drugs.  Allergies: No Known Allergies  Medications Prior to Admission  Medication Sig Dispense Refill  . levonorgestrel (MIRENA) 20 MCG/24HR IUD 1 each by Intrauterine route once.        No results found for this or any previous visit (from the past 48 hour(s)). Dg Tibia/fibula Left  07/12/2013   *RADIOLOGY REPORT*  Clinical Data: Lower leg injury.  LEFT TIBIA AND FIBULA - 2 VIEW  Comparison: None.  Findings: A comminuted fracture of the distal tibia and fibula is displaced laterally one shaft width.  The ankle and knee joints are located.  IMPRESSION: Comminuted distal tibia and fibula fracture with lateral displacement as described.   Original Report Authenticated By: Marin Roberts, M.D.    ROS she denies any shortness of breath, chest pains, unusual problems with bleeding in the past and has had no trouble with anesthesia in the  past during the delivery of her children.  Blood pressure 135/72, pulse 85, temperature 97.9 F (36.6 C), temperature source Oral, resp. rate 16, height 5\' 8"  (1.727 m), weight 68.493 kg (151 lb), last menstrual period 06/29/2013, SpO2 98.00%. Physical Exam Well-nourished well-developed 32 year old woman in no obvious distress normal breathing pattern. The left lower trembly is in a posterior splint, she moves her toes without difficulty and has normal sensation to all of her toes. The ER physician and Dr. Conley Canal both reported this to be a closed fracture I did not take the dressing down. Full range of motion of both knees hips shoulders elbows and wrists no cuts scrapes or abrasions to the skin.  Assessment/Plan Assessment: Closed mildly comminuted spiral distal tib-fib fracture at the junction of the middle and distal thirds shortened 2 cm and externally rotated 60.  Plan: Stabilization with a locked intramedullary rod was discussed at length with the patient. This will restore both a length and rotational stability to the fracture as well is allowing early motion of the knee and ankle. Risks and benefits of surgery including nerve vessel tendon damage and infection as well as blood clots were discussed at length with the patient as well as anesthesia risks. We will attempt to get this done today to decrease pain increase function.  [name] 07/13/2013, 7:14 AM

## 2013-07-13 NOTE — Progress Notes (Signed)
At 1000 pt unable to void, urine specimen needed for pregnancy test.  Dr. Turner Daniels said to go ahead and do I&O catheter.  1000 ml of clear, yellow urine emptied from bladder.  Area around urethra had milky, dark, thick discharge.

## 2013-07-13 NOTE — Preoperative (Signed)
Beta Blockers   Reason not to administer Beta Blockers:Not Applicable 

## 2013-07-13 NOTE — Progress Notes (Signed)
At 1645 pt unable to void, bladder scan showed greater than 999 in bladder.  Crystal Burn, PA notified order received to do I&O cath.  1000 ml of clear yellow urine emptied from bladder.  Area around urethra had a thick, milky, light brown discharge.

## 2013-07-13 NOTE — Interval H&P Note (Signed)
History and Physical Interval Note:  07/13/2013 11:09 AM  Crystal Gray  has presented today for surgery, with the diagnosis of left tib/fib fracture  The various methods of treatment have been discussed with the patient and family. After consideration of risks, benefits and other options for treatment, the patient has consented to  Procedure(s): INTRAMEDULLARY (IM) NAIL TIBIAL (Left) as a surgical intervention .  The patient's history has been reviewed, patient examined, no change in status, stable for surgery.  I have reviewed the patient's chart and labs.  Questions were answered to the patient's satisfaction.     Nestor Lewandowsky

## 2013-07-14 ENCOUNTER — Encounter (HOSPITAL_COMMUNITY): Payer: Self-pay | Admitting: Orthopedic Surgery

## 2013-07-14 LAB — URINE CULTURE

## 2013-07-14 LAB — PROTIME-INR
INR: 1.22 (ref 0.00–1.49)
Prothrombin Time: 15.1 seconds (ref 11.6–15.2)

## 2013-07-14 MED ORDER — OXYCODONE-ACETAMINOPHEN 5-325 MG PO TABS: 1.0000 | ORAL_TABLET | ORAL | Status: DC | PRN

## 2013-07-14 MED ORDER — ASPIRIN EC 325 MG PO TBEC: 325.0000 mg | DELAYED_RELEASE_TABLET | Freq: Two times a day (BID) | ORAL | Status: DC

## 2013-07-14 NOTE — Discharge Summary (Signed)
Patient ID: Crystal Gray MRN: [id number] DOB/AGE: 1981/03/05 32 y.o.  Admit date: 07/12/2013 Discharge date: 07/14/2013  Admission Diagnoses:  Principal Problem:   Left tibia and fibula fracture at the junction of middle and distal thirds.   Discharge Diagnoses:  Same  Past Medical History  Diagnosis Date  . No pertinent past medical history     Surgeries: Procedure(s): INTRAMEDULLARY (IM) NAIL TIBIAL on 07/12/2013 - 07/13/2013   Consultants: Treatment Team:  Nestor Lewandowsky, MD  Discharged Condition: Improved  Hospital Course: Crystal Gray is an 32 y.o. female who was admitted 07/12/2013 for operative treatment ofLeft tibial fracture. Patient has severe unremitting pain that affects sleep, daily activities, and work/hobbies. After pre-op clearance the patient was taken to the operating room on 07/12/2013 - 07/13/2013 and underwent  Procedure(s): INTRAMEDULLARY (IM) NAIL TIBIAL.    Patient was given perioperative antibiotics: Anti-infectives   Start     Dose/Rate Route Frequency Ordered Stop   07/13/13 1059  ceFAZolin (ANCEF) 2-3 GM-% IVPB SOLR    Comments:  MARTINELLI, JOHN: cabinet override      07/13/13 1059 07/13/13 2259       Patient was given sequential compression devices, early ambulation, and chemoprophylaxis to prevent DVT.  Patient benefited maximally from hospital stay and there were no complications.    Recent vital signs: Patient Vitals for the past 24 hrs:  BP Temp Temp src Pulse Resp SpO2  07/14/13 0613 136/79 mmHg 98.4 F (36.9 C) Oral 89 18 100 %  07/13/13 2147 153/82 mmHg 97.6 F (36.4 C) - 80 14 100 %  07/13/13 1531 143/84 mmHg 98.5 F (36.9 C) - 80 12 97 %  07/13/13 1515 - 97.6 F (36.4 C) - - - -  07/13/13 1509 - - - 92 - 96 %  07/13/13 1507 121/74 mmHg - - - - -  07/13/13 1500 - - - 63 11 100 %  07/13/13 1452 130/82 mmHg - - 79 15 100 %  07/13/13 1450 - - - 71 18 100 %  07/13/13 1445 - - - 58 16 100 %  07/13/13 1437 134/79 mmHg - -  67 13 100 %  07/13/13 1435 - - - 82 14 100 %  07/13/13 1430 - - - 58 16 100 %  07/13/13 1422 132/79 mmHg - - 64 11 100 %  07/13/13 1415 - - - 91 18 100 %  07/13/13 1407 141/87 mmHg - - 79 15 100 %  07/13/13 1400 - - - 75 14 100 %  07/13/13 1352 141/81 mmHg - - 82 15 100 %  07/13/13 1345 - - - 78 13 100 %  07/13/13 1337 138/86 mmHg - - 77 13 100 %  07/13/13 1330 - - - 80 13 100 %  07/13/13 1322 - - - 93 12 100 %  07/13/13 1315 134/87 mmHg 98.6 F (37 C) - 88 14 100 %  07/13/13 0900 134/71 mmHg 98.4 F (36.9 C) - 91 14 -  07/13/13 0819 - - - - 11 98 %     Recent laboratory studies:  Recent Labs  07/13/13 1650  WBC 7.4  HGB 11.8*  HCT 35.5*  PLT 160  CREATININE 0.75     Discharge Medications:     Medication List         aspirin EC 325 MG tablet  Take 1 tablet (325 mg total) by mouth 2 (two) times daily.     levonorgestrel 20 MCG/24HR IUD  Commonly known as:  MIRENA  1 each by Intrauterine route once.     oxyCODONE-acetaminophen 5-325 MG per tablet  Commonly known as:  PERCOCET/ROXICET  Take 1-2 tablets by mouth every 4 (four) hours as needed.        Diagnostic Studies: Dg Tibia/fibula Left  07/13/2013   *RADIOLOGY REPORT*  Clinical Data: ORIF of the left tibia.  Left tibial nail placement.  DG C-ARM 1-60 MIN,LEFT TIBIA AND FIBULA - 2 VIEW  Technique: Five intraoperative fluoroscopic spot films.  Comparison:  07/12/2013.  Findings:  Fluoroscopy time:  2 minutes 10 seconds.  Images demonstrate placement of an antegrade tibial nail with proximal and distal interlocking screws.  Distal tibia and fibular fractures noted.  IMPRESSION: Antegrade left tibial nail placement.   Original Report Authenticated By: Andreas Newport, M.D.   Dg Tibia/fibula Left  07/12/2013   *RADIOLOGY REPORT*  Clinical Data: Lower leg injury.  LEFT TIBIA AND FIBULA - 2 VIEW  Comparison: None.  Findings: A comminuted fracture of the distal tibia and fibula is displaced laterally one shaft width.   The ankle and knee joints are located.  IMPRESSION: Comminuted distal tibia and fibula fracture with lateral displacement as described.   Original Report Authenticated By: Marin Roberts, M.D.   Dg C-arm 1-60 Min  07/13/2013   *RADIOLOGY REPORT*  Clinical Data: ORIF of the left tibia.  Left tibial nail placement.  DG C-ARM 1-60 MIN,LEFT TIBIA AND FIBULA - 2 VIEW  Technique: Five intraoperative fluoroscopic spot films.  Comparison:  07/12/2013.  Findings:  Fluoroscopy time:  2 minutes 10 seconds.  Images demonstrate placement of an antegrade tibial nail with proximal and distal interlocking screws.  Distal tibia and fibular fractures noted.  IMPRESSION: Antegrade left tibial nail placement.   Original Report Authenticated By: Andreas Newport, M.D.    Disposition: 01-Home or Self Care      Discharge Orders   Future Orders Complete By Expires     Call MD / Call 911  As directed     Comments:      If you experience chest pain or shortness of breath, CALL 911 and be transported to the hospital emergency room.  If you develope a fever above 101 F, pus (white drainage) or increased drainage or redness at the wound, or calf pain, call your surgeon's office.    Change dressing  As directed     Comments:      Change dressing on POD #5.  You may clean the incision with alcohol prior to redressing.    Do not put a pillow under the knee. Place it under the heel.  As directed     Driving restrictions  As directed     Comments:      No driving for 2 weeks    Increase activity slowly as tolerated  As directed     Patient may shower  As directed     Comments:      You may shower without a dressing once there is no drainage.  Do not wash over the wound.  If drainage remains, cover wound with plastic wrap and then shower.       Follow-up Information   Follow up with Nestor Lewandowsky, MD In 7 days.   Contact information:   [address] [place] [address] [phone]         Signed: Nestor Lewandowsky 07/14/2013, 7:40 AM

## 2013-07-14 NOTE — Progress Notes (Signed)
Patient has continued to void through out this shift of clear, yellow urine in the [facility].

## 2013-07-14 NOTE — Progress Notes (Signed)
D/C instructions ans scripts given to Pt. Husband at bedside to D/C Pt.

## 2013-07-14 NOTE — Progress Notes (Signed)
Orthopedic Tech Progress Note Patient Details:  Crystal Gray 1981-10-15 [id number]  Ortho Devices Type of Ortho Device: Crutches Ortho Device/Splint Location: left leg Ortho Device/Splint Interventions: Adjustment   Cammer, Mickie Bail 07/14/2013, 11:38 AM

## 2013-07-14 NOTE — Evaluation (Signed)
Physical Therapy Evaluation Patient Details Name: Crystal Gray MRN: [id number] DOB: 06-21-1981 Today's Date: 07/14/2013 Time: 4742-5956 PT Time Calculation (min): 39 min  PT Assessment / Plan / Recommendation History of Present Illness   32 y.o. female who was admitted 07/12/2013 Left tibial fracture and underwent Procedure(s): INTRAMEDULLARY (IM) NAIL TIBIAL   Clinical Impression  Pt presents at supervision to modified independent with functional mobility tasks.  Uses crutches safely and maintains PWB <20 lbs consistently (preferentially NWB).  Able to ascend/descend stairs safely to access home.  Has appropriate set up and only needs crutches for home (pt is 5'8").  Educated on positioning and use of ice to operated limb for pain management and to promote healing.  Educated on care for UE's due to use of crutches and on stretching for left hip/low back to prevent mechanical issues due to PWB status.  Pt demonstrates and verbalize full understanding.  Recommend OPPT for progressive rehabilitation once cleared by MD, no other needs at this time.  RN verbally updated.    PT Assessment  All further PT needs can be met in the next venue of care    Follow Up Recommendations  Outpatient PT (at discretion of pt and MD)    Does the patient have the potential to tolerate intense rehabilitation      Barriers to Discharge        Equipment Recommendations  Crutches    Recommendations for Other Services     Frequency      Precautions / Restrictions Precautions Required Braces or Orthoses: Other Brace/Splint (post op splint to operated leg) Restrictions Weight Bearing Restrictions: Yes LLE Weight Bearing: Partial weight bearing LLE Partial Weight Bearing Percentage or Pounds: 20 lb   Pertinent Vitals/Pain Min to moderated along tibial shaft; ice and elevation following assessment.      Mobility  Bed Mobility Bed Mobility: Supine to Sit;Sit to Supine Supine to Sit: 6: Modified  independent (Device/Increase time) Sit to Supine: 6: Modified independent (Device/Increase time) Transfers Transfers: Sit to Stand;Stand to Sit Sit to Stand: 5: Supervision;From bed;With upper extremity assist Stand to Sit: 5: Supervision;With upper extremity assist;To bed Details for Transfer Assistance: initial instructional cues for safest technique with crutches to prevent >20 lb WB on operated leg Ambulation/Gait Ambulation/Gait Assistance: 5: Supervision Ambulation Distance (Feet): 160 Feet Assistive device: Crutches Ambulation/Gait Assistance Details: initially instructional cues re: how to maintain PWB and crutch walking technique Gait velocity: unmeasured General Gait Details: able to perform crutch walking and maintain NWB over level surfaces with minimal fatigue or increased pain.   Stairs: Yes Stairs Assistance: 5: Supervision Stairs Assistance Details (indicate cue type and reason): demostrational cues for fwd and sideways with rail and with crutches; handout used too. Stair Management Technique: One rail Right;One rail Left;Forwards;Sideways;With crutches Number of Stairs: 3    Exercises     PT Diagnosis: Difficulty walking;Acute pain  PT Problem List: Decreased range of motion;Decreased mobility;Pain PT Treatment Interventions:       PT Goals(Current goals can be found in the care plan section) Acute Rehab PT Goals Patient Stated Goal: return to fully independent PT Goal Formulation: With patient  Visit Information  Last PT Received On: 07/14/13 Assistance Needed: +1       Prior Functioning  Home Living Family/patient expects to be discharged to:: Private residence Living Arrangements: Spouse/significant other;Children Available Help at Discharge: Family;Available PRN/intermittently Type of Home: House Home Access: Stairs to enter [employer] of Steps: 5 Entrance Stairs-Rails: Can reach both Home  Layout: Two level;Able to live on main level  with bedroom/bathroom Home Equipment: None Prior Function Level of Independence: Independent Communication Communication: No difficulties    Cognition  Cognition Arousal/Alertness: Awake/alert Behavior During Therapy: WFL for tasks assessed/performed Overall Cognitive Status: Within Functional Limits for tasks assessed    Extremity/Trunk Assessment Upper Extremity Assessment Upper Extremity Assessment: Overall WFL for tasks assessed Lower Extremity Assessment Lower Extremity Assessment: LLE deficits/detail LLE: Unable to fully assess due to pain;Unable to fully assess due to immobilization   Balance    End of Session PT - End of Session Equipment Utilized During Treatment: Gait belt Activity Tolerance: Patient tolerated treatment well;Patient limited by fatigue Patient left: in bed;with call bell/phone within reach Nurse Communication: Mobility status  GP Functional Assessment Tool Used: clinical judgement Functional Limitation: Mobility: Walking and moving around Mobility: Walking and Moving Around Current Status (W0981): At least 60 percent but less than 80 percent impaired, limited or restricted Mobility: Walking and Moving Around Goal Status [phone]): At least 1 percent but less than 20 percent impaired, limited or restricted   Dennis Bast 07/14/2013, 10:25 AM

## 2013-07-14 NOTE — Progress Notes (Signed)
Patient starting voiding on her own on [facility].  Will continue to monitor.

## 2014-10-25 ENCOUNTER — Encounter (HOSPITAL_COMMUNITY): Payer: Self-pay | Admitting: Orthopedic Surgery

## 2016-09-19 ENCOUNTER — Ambulatory Visit (INDEPENDENT_AMBULATORY_CARE_PROVIDER_SITE_OTHER): Payer: 59 | Admitting: Family Medicine

## 2016-09-19 VITALS — BP 100/66 | HR 68 | Temp 98.7°F | Resp 18 | Ht 68.0 in | Wt 150.0 lb

## 2016-09-19 DIAGNOSIS — J020 Streptococcal pharyngitis: Secondary | ICD-10-CM

## 2016-09-19 LAB — POCT RAPID STREP A (OFFICE): RAPID STREP A SCREEN: POSITIVE — AB

## 2016-09-19 MED ORDER — AMOXICILLIN 500 MG PO CAPS: 1000.0000 mg | ORAL_CAPSULE | Freq: Two times a day (BID) | ORAL | 0 refills | Status: DC

## 2016-09-19 NOTE — Progress Notes (Signed)
By signing my name below, I, Crystal Gray, attest that this documentation has been prepared under the direction and in the presence of Crystal SimmerKristi Shirlyn Savin, MD.  Electronically Signed: Arvilla MarketMesha Gray, Medical Scribe. 09/19/2016. 6:05 PM.  Subjective:    Patient ID: Crystal Gray, female    DOB: 1981/05/03, 35 y.o.   MRN: [id number]  09/19/2016  Sore Throat (MONDAY)   HPI  HPI Comments: Crystal ReichertChristina Gray is a 35 y.o. female who presents to the [facility] complaining of sore throat onset 2 days ago. Pt fever last night, swollen lymph nodes, and HA yesterday. Pt has taken Tylenol without relief to her symptoms. 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 Pt denies sick contacts, ear pain, nasal congestion, coughing, nausea, emesis, abdominal pain, and rash.  Review of Systems  Constitutional: Positive for fever.  HENT: Positive for sore throat. Negative for congestion and ear pain.   Respiratory: Negative for cough.   Gastrointestinal: Negative for abdominal pain, nausea and vomiting.  Skin: Negative for rash.  Neurological: Positive for headaches.   Past Medical History:  Diagnosis Date  . Depression   . No pertinent past medical history    Past Surgical History:  Procedure Laterality Date  . FRACTURE SURGERY    . TIBIA IM NAIL INSERTION Left 07/13/2013   Procedure: INTRAMEDULLARY (IM) NAIL TIBIAL;  Surgeon: Crystal LewandowskyFrank J Rowan, MD;  Location: [facility] OR;  Service: Orthopedics;  Laterality: Left;  . WISDOM TOOTH EXTRACTION     No Known Allergies  Social History   Social History  . Marital status: Married    Spouse name: N/A  . Number of children: N/A  . Years of education: N/A   Occupational History  . Not on file.   Social History Main Topics  . Smoking status: Never Smoker  . Smokeless tobacco: Never Used  . Alcohol use No  . Drug use: No  . Sexual activity: Not on file   Other Topics Concern  . Not on file   Social History Narrative  . No narrative on  file   Family History  Problem Relation Age of Onset  . Cancer Maternal Grandfather   . Heart disease Maternal Grandfather        Objective:    BP 100/66 (BP Location: Right Arm, Patient Position: Sitting, Cuff Size: Small)   Pulse 68   Temp 98.7 F (37.1 C) (Oral)   Resp 18   Ht 5\' 8"  (1.727 m)   Wt 150 lb (68 kg)   SpO2 100%   BMI 22.81 kg/m  Physical Exam  Constitutional: She is oriented to person, place, and time. She appears well-developed and well-nourished. No distress.  HENT:  Head: Normocephalic and atraumatic.  Right Ear: Tympanic membrane, external ear and ear canal normal.  Left Ear: Tympanic membrane, external ear and ear canal normal.  Mouth/Throat: Uvula is midline and mucous membranes are normal. Posterior oropharyngeal erythema (diffuse) present. No oropharyngeal exudate, posterior oropharyngeal edema or tonsillar abscesses.  Eyes: Conjunctivae are normal. Pupils are equal, round, and reactive to light.  Neck: Normal range of motion. Neck supple.  Cardiovascular: Normal rate, regular rhythm and normal heart sounds.  Exam reveals no gallop and no friction rub.   No murmur heard. Pulmonary/Chest: Effort normal and breath sounds normal. She has no wheezes. She has no rales.  Lymphadenopathy:    She has cervical adenopathy (anterior R>L).  Neurological: She is alert and oriented to person, place, and time.  Skin: She is  not diaphoretic.  Psychiatric: She has a normal mood and affect. Her behavior is normal.  Nursing note and vitals reviewed.  Results for orders placed or performed in visit on 09/19/16  POCT rapid strep A  Result Value Ref Range   Rapid Strep A Screen Positive (A) Negative   Assessment & Plan:   1. Streptococcal sore throat     Orders Placed This Encounter  Procedures  . Culture, Group A Strep    Order Specific Question:   Source    Answer:   oropharynx  . POCT rapid strep A   Meds ordered this encounter  Medications  .  amoxicillin (AMOXIL) 500 MG capsule    Sig: Take 2 capsules (1,000 mg total) by mouth 2 (two) times daily.    Dispense:  40 capsule    Refill:  0    No Follow-up on file.  I personally performed the services described in this documentation, which was scribed in my presence. The recorded information has been reviewed and considered.  Crystal Gray, M.D. [facility] [address] [place], [place]  [address] [phone] phone [phone] fax

## 2016-09-19 NOTE — Patient Instructions (Addendum)
   IF you received an x-ray today, you will receive an invoice from [facility]. Please contact [facility] at [phone] with questions or concerns regarding your invoice.   IF you received labwork today, you will receive an invoice from [facility]. Please contact [facility] at [phone] with questions or concerns regarding your invoice.   Our billing staff will not be able to assist you with questions regarding bills from these companies.  You will be contacted with the lab results as soon as they are available. The fastest way to get your results is to activate your My Chart account. Instructions are located on the last page of this paperwork. If you have not heard from us regarding the results in 2 weeks, please contact this office.    Strep Throat Strep throat is a bacterial infection of the throat. Your health care provider may call the infection tonsillitis or pharyngitis, depending on whether there is swelling in the tonsils or at the back of the throat. Strep throat is most common during the cold months of the year in children who are 5-15 years of age, but it can happen during any season in people of any age. This infection is spread from person to person (contagious) through coughing, sneezing, or close contact. CAUSES Strep throat is caused by the bacteria called Streptococcus pyogenes. RISK FACTORS This condition is more likely to develop in:  People who spend time in crowded places where the infection can spread easily.  People who have close contact with someone who has strep throat. SYMPTOMS Symptoms of this condition include:  Fever or chills.   Redness, swelling, or pain in the tonsils or throat.  Pain or difficulty when swallowing.  White or yellow spots on the tonsils or throat.  Swollen, tender glands in the neck or under the jaw.  Red rash all over the body (rare). DIAGNOSIS This condition is diagnosed by  performing a rapid strep test or by taking a swab of your throat (throat culture test). Results from a rapid strep test are usually ready in a few minutes, but throat culture test results are available after one or two days. TREATMENT This condition is treated with antibiotic medicine. HOME CARE INSTRUCTIONS Medicines  Take over-the-counter and prescription medicines only as told by your health care provider.  Take your antibiotic as told by your health care provider. Do not stop taking the antibiotic even if you start to feel better.  Have family members who also have a sore throat or fever tested for strep throat. They may need antibiotics if they have the strep infection. Eating and Drinking  Do not share food, drinking cups, or personal items that could cause the infection to spread to other people.  If swallowing is difficult, try eating soft foods until your sore throat feels better.  Drink enough fluid to keep your urine clear or pale yellow. General Instructions  Gargle with a salt-water mixture 3-4 times per day or as needed. To make a salt-water mixture, completely dissolve -1 tsp of salt in 1 cup of warm water.  Make sure that all household members wash their hands well.  Get plenty of rest.  Stay home from school or work until you have been taking antibiotics for 24 hours.  Keep all follow-up visits as told by your health care provider. This is important. SEEK MEDICAL CARE IF:  The glands in your neck continue to get bigger.  You develop a rash, cough, or earache.    You cough up a thick liquid that is green, yellow-brown, or bloody.  You have pain or discomfort that does not get better with medicine.  Your problems seem to be getting worse rather than better.  You have a fever. SEEK IMMEDIATE MEDICAL CARE IF:  You have new symptoms, such as vomiting, severe headache, stiff or painful neck, chest pain, or shortness of breath.  You have severe throat pain,  drooling, or changes in your voice.  You have swelling of the neck, or the skin on the neck becomes red and tender.  You have signs of dehydration, such as fatigue, dry mouth, and decreased urination.  You become increasingly sleepy, or you cannot wake up completely.  Your joints become red or painful.   This information is not intended to replace advice given to you by your health care provider. Make sure you discuss any questions you have with your health care provider.   Document Released: 12/07/2000 Document Revised: 08/31/2015 Document Reviewed: 04/04/2015 Elsevier Interactive Patient Education 2016 Elsevier Inc.  

## 2016-09-21 ENCOUNTER — Telehealth: Payer: Self-pay

## 2016-09-21 NOTE — Telephone Encounter (Signed)
PATIENT STATES SHE SAW DR. Katrinka BlazingSMITH ON Wednesday FOR STREP THROAT. SHE WAS PRESCRIBED AMOXICILLIN 500 MG. [place] AND REALIZED THAT SHE FORGOT TO TAKE HER MEDICINE WITH HER. SHE WOULD LIKE US TO CALL IT INTO THE PHARMACY IN [place]. BEST PHONE [phone] (CELL)  PHARMACY CHOICE IS [facility] ON [address] [place] (PHONE) [phone]. MBC

## 2016-09-24 NOTE — Telephone Encounter (Signed)
Left message to call back to discuss how many doses missed? How she is feeling? If she is back home now and resumed antibiotics?  I apologized for the delay in our response. (she called us 09/21/16)

## 2016-10-01 NOTE — Telephone Encounter (Signed)
LMOM for pt to f/up since she has not returned our call. Asked for CB if she did not get back into town and complete her Abx and/or if Sxs haven't resolved.

## 2017-01-21 DIAGNOSIS — Z01419 Encounter for gynecological examination (general) (routine) without abnormal findings: Secondary | ICD-10-CM | POA: Diagnosis not present

## 2017-01-21 DIAGNOSIS — Z124 Encounter for screening for malignant neoplasm of cervix: Secondary | ICD-10-CM | POA: Diagnosis not present

## 2017-01-21 DIAGNOSIS — Z6823 Body mass index (BMI) 23.0-23.9, adult: Secondary | ICD-10-CM | POA: Diagnosis not present

## 2017-03-19 ENCOUNTER — Ambulatory Visit (INDEPENDENT_AMBULATORY_CARE_PROVIDER_SITE_OTHER): Payer: 59 | Admitting: Physician Assistant

## 2017-03-19 VITALS — BP 133/81 | HR 102 | Temp 98.6°F | Resp 16 | Ht 68.0 in | Wt 153.0 lb

## 2017-03-19 DIAGNOSIS — J020 Streptococcal pharyngitis: Secondary | ICD-10-CM | POA: Diagnosis not present

## 2017-03-19 DIAGNOSIS — J029 Acute pharyngitis, unspecified: Secondary | ICD-10-CM | POA: Diagnosis not present

## 2017-03-19 LAB — POCT RAPID STREP A (OFFICE): RAPID STREP A SCREEN: POSITIVE — AB

## 2017-03-19 MED ORDER — AMOXICILLIN 500 MG PO CAPS: 500.0000 mg | ORAL_CAPSULE | Freq: Two times a day (BID) | ORAL | 0 refills | Status: DC

## 2017-03-19 NOTE — Progress Notes (Signed)
PRIMARY [facility] AT [facility] [address], [place] [phone]  Date:  03/19/2017   Name:  Crystal Gray   DOB:  1981-01-08   MRN:  [id number]  PCP:  Maryelizabeth RowanEWEY,ELIZABETH, MD    History of Present Illness:  Crystal Gray is a 36 y.o. female patient who presents to [facility] at [facility] with  Chief Complaint  Patient presents with  . Sore Throat  . Headache  . Lymphadenopathy     Throat pain, headache, and swollen lymph nodes several days ago.  She has hx of strep throat 6 months ago.  No fever.  No sob or dyspnea.  Pain with swallowing but no difficulty.  No sick contacts.    Patient Active Problem List   Diagnosis Date Noted  . Left tibia and fibula fracture at the junction of middle and distal thirds. 07/13/2013  . Stress fracture of tibia 05/07/2012    Past Medical History:  Diagnosis Date  . Depression   . No pertinent past medical history     Past Surgical History:  Procedure Laterality Date  . FRACTURE SURGERY    . TIBIA IM NAIL INSERTION Left 07/13/2013   Procedure: INTRAMEDULLARY (IM) NAIL TIBIAL;  Surgeon: Nestor LewandowskyFrank J Rowan, MD;  Location: [facility] OR;  Service: Orthopedics;  Laterality: Left;  . WISDOM TOOTH EXTRACTION      Social History  Substance Use Topics  . Smoking status: Never Smoker  . Smokeless tobacco: Never Used  . Alcohol use No    Family History  Problem Relation Age of Onset  . Cancer Maternal Grandfather   . Heart disease Maternal Grandfather     No Known Allergies  Medication list has been reviewed and updated.  Current Outpatient Prescriptions on File Prior to Visit  Medication Sig Dispense Refill  . levonorgestrel (MIRENA) 20 MCG/24HR IUD 1 each by Intrauterine route once.    Marland Kitchen. oxyCODONE-acetaminophen (PERCOCET/ROXICET) 5-325 MG per tablet Take 1-2 tablets by mouth every 4 (four) hours as needed. (Patient not taking: Reported on 09/19/2016) 30 tablet 0   No current facility-administered medications on file prior to visit.      ROS ROS otherwise unremarkable unless listed above.  Physical Examination: BP 133/81   Pulse (!) 102   Temp 98.6 F (37 C) (Oral)   Resp 16   Ht 5\' 8"  (1.727 m)   Wt 153 lb (69.4 kg)   SpO2 98%   BMI 23.26 kg/m  Ideal Body Weight: Weight in (lb) to have BMI = 25: 164.1  Physical Exam  Constitutional: She is oriented to person, place, and time. She appears well-developed and well-nourished. No distress.  HENT:  Head: Normocephalic and atraumatic.  Right Ear: External ear normal.  Left Ear: External ear normal.  Eyes: Conjunctivae and EOM are normal. Pupils are equal, round, and reactive to light.  Cardiovascular: Normal rate.   Pulmonary/Chest: Effort normal. No respiratory distress.  Neurological: She is alert and oriented to person, place, and time.  Skin: She is not diaphoretic.  Psychiatric: She has a normal mood and affect. Her behavior is normal.   Results for orders placed or performed in visit on 03/19/17  POCT rapid strep A  Result Value Ref Range   Rapid Strep A Screen Positive (A) Negative     Assessment and Plan: Crystal Gray is a 36 y.o. female who is here today for cc of throat pain Strep throat - Plan: amoxicillin (AMOXIL) 500 MG capsule  Sorethroat - Plan: POCT rapid strep  A, Culture, Group A Strep  Trena Platt, PA-C [facility] 3/27/20186:36 PM

## 2017-03-19 NOTE — Patient Instructions (Addendum)
Gargle with warm salt water.   Please take tylenol or ibuprofen for pain or fever.    Strep Throat Strep throat is an infection of the throat. It is caused by germs. Strep throat spreads from person to person because of coughing, sneezing, or close contact. Follow these instructions at home: Medicines   Take over-the-counter and prescription medicines only as told by your doctor.  Take your antibiotic medicine as told by your doctor. Do not stop taking the medicine even if you feel better.  Have family members who also have a sore throat or fever go to a doctor. Eating and drinking   Do not share food, drinking cups, or personal items.  Try eating soft foods until your sore throat feels better.  Drink enough fluid to keep your pee (urine) clear or pale yellow. General instructions   Rinse your mouth (gargle) with a salt-water mixture 3-4 times per day or as needed. To make a salt-water mixture, stir -1 tsp of salt into 1 cup of warm water.  Make sure that all people in your house wash their hands well.  Rest.  Stay home from school or work until you have been taking antibiotics for 24 hours.  Keep all follow-up visits as told by your doctor. This is important. Contact a doctor if:  Your neck keeps getting bigger.  You get a rash, cough, or earache.  You cough up thick liquid that is green, yellow-brown, or bloody.  You have pain that does not get better with medicine.  Your problems get worse instead of getting better.  You have a fever. Get help right away if:  You throw up (vomit).  You get a very bad headache.  You neck hurts or it feels stiff.  You have chest pain or you are short of breath.  You have drooling, very bad throat pain, or changes in your voice.  Your neck is swollen or the skin gets red and tender.  Your mouth is dry or you are peeing less than normal.  You keep feeling more tired or it is hard to wake up.  Your joints are red or they  hurt. This information is not intended to replace advice given to you by your health care provider. Make sure you discuss any questions you have with your health care provider. Document Released: 05/28/2008 Document Revised: 08/08/2016 Document Reviewed: 04/04/2015 Elsevier Interactive Patient Education  2017 [employer]    IF you received an x-ray today, you will receive an invoice from [facility]. Please contact [facility] at [phone] with questions or concerns regarding your invoice.   IF you received labwork today, you will receive an invoice from [facility]. Please contact [facility] at [phone] with questions or concerns regarding your invoice.   Our billing staff will not be able to assist you with questions regarding bills from these companies.  You will be contacted with the lab results as soon as they are available. The fastest way to get your results is to activate your My Chart account. Instructions are located on the last page of this paperwork. If you have not heard from us regarding the results in 2 weeks, please contact this office.

## 2017-05-21 ENCOUNTER — Encounter: Payer: Self-pay | Admitting: Physician Assistant

## 2017-05-21 ENCOUNTER — Ambulatory Visit (INDEPENDENT_AMBULATORY_CARE_PROVIDER_SITE_OTHER): Payer: 59 | Admitting: Physician Assistant

## 2017-05-21 VITALS — BP 145/82 | HR 56 | Temp 97.8°F | Resp 16 | Wt 157.8 lb

## 2017-05-21 DIAGNOSIS — N390 Urinary tract infection, site not specified: Secondary | ICD-10-CM

## 2017-05-21 DIAGNOSIS — R319 Hematuria, unspecified: Secondary | ICD-10-CM | POA: Diagnosis not present

## 2017-05-21 DIAGNOSIS — R300 Dysuria: Secondary | ICD-10-CM | POA: Diagnosis not present

## 2017-05-21 LAB — POCT URINALYSIS DIP (MANUAL ENTRY)
Bilirubin, UA: NEGATIVE
Glucose, UA: NEGATIVE mg/dL
Ketones, POC UA: NEGATIVE mg/dL
NITRITE UA: NEGATIVE
PH UA: 7 (ref 5.0–8.0)
Protein Ur, POC: NEGATIVE mg/dL
Spec Grav, UA: 1.01 (ref 1.010–1.025)
UROBILINOGEN UA: 0.2 U/dL

## 2017-05-21 LAB — POC MICROSCOPIC URINALYSIS (UMFC)

## 2017-05-21 MED ORDER — NITROFURANTOIN MONOHYD MACRO 100 MG PO CAPS: 100.0000 mg | ORAL_CAPSULE | Freq: Two times a day (BID) | ORAL | 0 refills | Status: AC

## 2017-05-21 NOTE — Progress Notes (Signed)
05/21/2017 at 6:06 PM  Crystal Gray / DOB: 1981-09-15 / MRN: [id number]  The patient has Stress fracture of tibia and Left tibia and fibula fracture at the junction of middle and distal thirds. on her problem list.  SUBJECTIVE  Crystal Gray is a 36 y.o. female who complains of dysuria, urinary frequency, abdominal pain and cloudy malordorous urine x 3 days. She denies hematuria, flank pain and vaginal discharge. Has not tried with no relief. Most recent UTI prior to this was >15 years ago.   She  has a past medical history of Depression and No pertinent past medical history.    Medications reviewed and updated by myself where necessary, and exist elsewhere in the encounter.   Crystal Gray has No Known Allergies. She  reports that she has never smoked. She has never used smokeless tobacco. She reports that she does not drink alcohol or use drugs. She  has no sexual activity history on file. The patient  has a past surgical history that includes Wisdom tooth extraction; Tibia IM nail insertion (Left, 07/13/2013); and Fracture surgery.  Her family history includes Cancer in her maternal grandfather; Heart disease in her maternal grandfather.  Review of Systems  Constitutional: Negative for chills, diaphoresis and fever.  Gastrointestinal: Negative for nausea and vomiting.    OBJECTIVE  Her  weight is 157 lb 12.8 oz (71.6 kg). Her oral temperature is 97.8 F (36.6 C). Her blood pressure is 145/82 (abnormal) and her pulse is 56 (abnormal). Her respiration is 16 and oxygen saturation is 100%.  The patient's body mass index is 23.99 kg/m.  Physical Exam  Constitutional: She is oriented to person, place, and time. She appears well-developed and well-nourished. No distress.  HENT:  Head: Normocephalic and atraumatic.  Eyes: Conjunctivae are normal.  Neck: Normal range of motion.  Respiratory: Effort normal.  GI: Soft. Normal appearance. There is tenderness (mild) in the  suprapubic area. There is no CVA tenderness.  Neurological: She is alert and oriented to person, place, and time.  Skin: Skin is warm and dry.  Psychiatric: She has a normal mood and affect.     BP Readings from Last 3 Encounters:  05/21/17 (!) 145/82  03/19/17 133/81  09/19/16 100/66    Results for orders placed or performed in visit on 05/21/17 (from the past 24 hour(s))  POCT urinalysis dipstick     Status: Abnormal   Collection Time: 05/21/17  5:50 PM  Result Value Ref Range   Color, UA yellow yellow   Clarity, UA hazy (A) clear   Glucose, UA negative negative mg/dL   Bilirubin, UA negative negative   Ketones, POC UA negative negative mg/dL   Spec Grav, UA 8.295 6.213 - 1.025   Blood, UA trace-intact (A) negative   pH, UA 7.0 5.0 - 8.0   Protein Ur, POC negative negative mg/dL   Urobilinogen, UA 0.2 0.2 or 1.0 E.U./dL   Nitrite, UA Negative Negative   Leukocytes, UA Small (1+) (A) Negative  POCT Microscopic Urinalysis ([facility])     Status: Abnormal   Collection Time: 05/21/17  5:53 PM  Result Value Ref Range   WBC,UR,HPF,POC Few (A) None WBC/hpf   RBC,UR,HPF,POC Few (A) None RBC/hpf   Bacteria Few (A) None, Too numerous to count   Mucus Present (A) Absent   Epithelial Cells, UR Per Microscopy Few (A) None, Too numerous to count cells/hpf   ASSESSMENT & PLAN  Crystal Gray was seen today for urinary tract infection.  Diagnoses and  all orders for this visit:  Dysuria -     POCT Microscopic Urinalysis ([facility]) -     POCT urinalysis dipstick -     Urine culture  Urinary tract infection with hematuria, site unspecified -     nitrofurantoin, macrocrystal-monohydrate, (MACROBID) 100 MG capsule; Take 1 capsule (100 mg total) by mouth 2 (two) times daily.    The patient was advised to call or come back to clinic if she does not see an improvement in symptoms, or worsens with the above plan.   Benjiman CoreBrittany Dhalia Zingaro, PA-C [facility] 05/21/2017 6:06 PM

## 2017-05-21 NOTE — Patient Instructions (Addendum)
In terms of urinary complaints, your labs do indicate you are having a UTI. I have prescribed macrobid for you to take twice daily for five days. You can also buy OTC AZO for relief of burning. This can cause the urine to have a orange appearance. I will send your urine off for culture. We should have these results in 48 hours. If you need a different antibiotic, I will call it in. Your symptoms should be improving in the next 24-48 hours, if they worsen or you develop new concerning symptoms, please return to office.   In terms of elevated blood pressure, I would like you to check your blood pressure at least a couple times over the next week outside of the office and document these values. It is best if you check the blood pressure at different times in the day. Your goal is <140/90. If your values are consistently above this goal, please return to office for further evaluation. If you start to have chest pain, blurred vision, shortness of breath, severe headache, lower leg swelling, or nausea/vomiting please seek care immediately here or at the ED.    Thank you for letting me participate in your health and well being.   Urinary Tract Infection, Adult A urinary tract infection (UTI) is an infection of any part of the urinary tract. The urinary tract includes the:  Kidneys.  Ureters.  Bladder.  Urethra. These organs make, store, and get rid of pee (urine) in the body. Follow these instructions at home:  Take over-the-counter and prescription medicines only as told by your doctor.  If you were prescribed an antibiotic medicine, take it as told by your doctor. Do not stop taking the antibiotic even if you start to feel better.  Avoid the following drinks:  Alcohol.  Caffeine.  Tea.  Carbonated drinks.  Drink enough fluid to keep your pee clear or pale yellow.  Keep all follow-up visits as told by your doctor. This is important.  Make sure to:  Empty your bladder often and  completely. Do not to hold pee for long periods of time.  Empty your bladder before and after sex.  Wipe from front to back after a bowel movement if you are female. Use each tissue one time when you wipe. Contact a doctor if:  You have back pain.  You have a fever.  You feel sick to your stomach (nauseous).  You throw up (vomit).  Your symptoms do not get better after 3 days.  Your symptoms go away and then come back. Get help right away if:  You have very bad back pain.  You have very bad lower belly (abdominal) pain.  You are throwing up and cannot keep down any medicines or water. This information is not intended to replace advice given to you by your health care provider. Make sure you discuss any questions you have with your health care provider. Document Released: 05/28/2008 Document Revised: 05/17/2016 Document Reviewed: 10/31/2015 Elsevier Interactive Patient Education  2017 [employer]   IF you received an x-ray today, you will receive an invoice from [facility]. Please contact [facility] at [phone] with questions or concerns regarding your invoice.   IF you received labwork today, you will receive an invoice from [facility]. Please contact [facility] at [phone] with questions or concerns regarding your invoice.   Our billing staff will not be able to assist you with questions regarding bills from these companies.  You will be contacted with the lab results  as soon as they are available. The fastest way to get your results is to activate your My Chart account. Instructions are located on the last page of this paperwork. If you have not heard from [place] regarding the results in 2 weeks, please contact this office.

## 2017-05-23 LAB — URINE CULTURE

## 2017-07-22 ENCOUNTER — Ambulatory Visit (HOSPITAL_COMMUNITY)
Admission: EM | Admit: 2017-07-22 | Discharge: 2017-07-22 | Disposition: A | Discharge status: Home / Self Care | Payer: 59 | Attending: Family Medicine | Admitting: Family Medicine

## 2017-07-22 ENCOUNTER — Encounter (HOSPITAL_COMMUNITY): Payer: Self-pay | Admitting: *Deleted

## 2017-07-22 DIAGNOSIS — R6889 Other general symptoms and signs: Secondary | ICD-10-CM | POA: Diagnosis not present

## 2017-07-22 MED ORDER — CETIRIZINE-PSEUDOEPHEDRINE ER 5-120 MG PO TB12: 1.0000 | ORAL_TABLET | Freq: Every day | ORAL | 0 refills | Status: DC

## 2017-07-22 MED ORDER — IPRATROPIUM BROMIDE 0.06 % NA SOLN: 2.0000 | Freq: Four times a day (QID) | NASAL | 12 refills | Status: DC

## 2017-07-22 NOTE — Discharge Instructions (Signed)
Use nasal spray and allergy medicine for nasal congestion. Take tylenol/motrin for pain and fever. Continue to monitor headache, if experiencing worsening of symptoms, nausea, vomiting, mental status change, to go the emergency department immediately for evaluation.

## 2017-07-22 NOTE — ED Triage Notes (Signed)
Body  Aches    Stiff  Neck        Fever  Earlier         Pt  Reports   Symptoms   X   2  Days          Pt  Reports  A  Slight  Amount  Of  diaerrhea   As   Well as  A  Decreased  Appetite

## 2017-07-22 NOTE — ED Notes (Signed)
Pt  And  Visitor   Advised  To  Wear  Masks  And  Door   Closed

## 2017-07-22 NOTE — ED Provider Notes (Signed)
CSN: [id number]     Arrival date & time 07/22/17  1413 History   None    Chief Complaint  Patient presents with  . Headache   (Consider location/radiation/quality/duration/timing/severity/associated sxs/prior Treatment) 36 year old female comes in with 2 day history of fever, headache, neck stiffness. Patient has also experienced some diarrhea. Headache is frontal. Denies cough, sore throat, nasal congestion. Denies abdominal pain, nausea, vomiting. Denies photophobia, mental status changes. Denies trouble breathing, trouble swallowing. Has been taking tylenol for fever and pain. Denies sick contact.       Past Medical History:  Diagnosis Date  . Depression   . No pertinent past medical history    Past Surgical History:  Procedure Laterality Date  . FRACTURE SURGERY    . TIBIA IM NAIL INSERTION Left 07/13/2013   Procedure: INTRAMEDULLARY (IM) NAIL TIBIAL;  Surgeon: Nestor LewandowskyFrank J Rowan, MD;  Location: [facility] OR;  Service: Orthopedics;  Laterality: Left;  . WISDOM TOOTH EXTRACTION     Family History  Problem Relation Age of Onset  . Cancer Maternal Grandfather   . Heart disease Maternal Grandfather    Social History  Substance Use Topics  . Smoking status: Never Smoker  . Smokeless tobacco: Never Used  . Alcohol use No   OB History    Gravida Para Term Preterm AB Living   4 3 3  0 0 4   SAB TAB Ectopic Multiple Live Births   0 0 0 1 2     Review of Systems  Reason unable to perform ROS: See HPI as above.    Allergies  Patient has no known allergies.  Home Medications   Prior to Admission medications   Medication Sig Start Date End Date Taking? Authorizing Provider  cetirizine-pseudoephedrine (ZYRTEC-D) 5-120 MG tablet Take 1 tablet by mouth daily. 07/22/17   Cathie HoopsYu, Sybilla Malhotra V, PA-C  ipratropium (ATROVENT) 0.06 % nasal spray Place 2 sprays into both nostrils 4 (four) times daily. 07/22/17   Belinda FisherYu, Alzada Brazee V, PA-C  levonorgestrel (MIRENA) 20 MCG/24HR IUD 1 each by Intrauterine route once.     [provider]   Meds Ordered and Administered this Visit  Medications - No data to display  BP 118/72 (BP Location: Right Arm)   Pulse (!) 116   Temp (!) 101.3 F (38.5 C) (Oral)   Resp (!) 22   SpO2 100%  No data found.   Physical Exam  Constitutional: She is oriented to person, place, and time. She appears well-developed and well-nourished. She appears distressed.  Patient laying on exam table, fatigued, but able to hold conversation without problem  HENT:  Head: Normocephalic and atraumatic.  Right Ear: External ear and ear canal normal. Tympanic membrane is not erythematous and not bulging. A middle ear effusion is present.  Left Ear: External ear and ear canal normal. Tympanic membrane is not erythematous and not bulging. A middle ear effusion is present.  Nose: Nose normal. Right sinus exhibits no maxillary sinus tenderness and no frontal sinus tenderness. Left sinus exhibits no maxillary sinus tenderness and no frontal sinus tenderness.  Mouth/Throat: Uvula is midline, oropharynx is clear and moist and mucous membranes are normal.  Eyes: Pupils are equal, round, and reactive to light. Conjunctivae are normal.  Neck: Normal range of motion. Neck supple. No spinous process tenderness and no muscular tenderness present. Normal range of motion present.  Cardiovascular: Normal rate, regular rhythm and normal heart sounds.  Exam reveals no gallop and no friction rub.   No murmur  heard. Pulmonary/Chest: Effort normal and breath sounds normal. She has no decreased breath sounds. She has no wheezes. She has no rhonchi. She has no rales.  Lymphadenopathy:    She has no cervical adenopathy.  Neurological: She is alert and oriented to person, place, and time. She has normal strength. She is not disoriented. No cranial nerve deficit or sensory deficit. She displays a negative Romberg sign.  Negative Brudzinski and Kernig  Skin: Skin is warm and dry.  Psychiatric: She has a  normal mood and affect. Her behavior is normal. Judgment normal.    Urgent Care Course     Procedures (including critical care time)  Labs Review Labs Reviewed - No data to display  Imaging Review No results found.    MDM   1. Flu-like symptoms    Discussed with patient history and exam most consistent with viral URI. Although patient without nasal congestion symptoms, mid ear effusion seen on exam that could be contributing to her headache. Take Zyrtec-D and atrovent nasal spray for nasal congestion. Continue tylenol/motrin for pain and fever. Given patient with Full ROM of the neck, no N/V, no mental status change, low suspicion for bacterial meningitis right now. Strict return precautions given.   Belinda FisherYu, Priscillia Fouch V, PA-C 07/22/17 2159

## 2017-07-23 ENCOUNTER — Emergency Department (HOSPITAL_COMMUNITY)
Admission: EM | Admit: 2017-07-23 | Discharge: 2017-07-24 | Disposition: A | Discharge status: Home / Self Care | Payer: 59 | Attending: Emergency Medicine | Admitting: Emergency Medicine

## 2017-07-23 ENCOUNTER — Encounter (HOSPITAL_COMMUNITY): Payer: Self-pay | Admitting: Emergency Medicine

## 2017-07-23 DIAGNOSIS — N390 Urinary tract infection, site not specified: Secondary | ICD-10-CM

## 2017-07-23 DIAGNOSIS — R509 Fever, unspecified: Secondary | ICD-10-CM | POA: Diagnosis not present

## 2017-07-23 DIAGNOSIS — Z79899 Other long term (current) drug therapy: Secondary | ICD-10-CM | POA: Insufficient documentation

## 2017-07-23 LAB — URINALYSIS, ROUTINE W REFLEX MICROSCOPIC
Bilirubin Urine: NEGATIVE
Glucose, UA: NEGATIVE mg/dL
Ketones, ur: 20 mg/dL — AB
NITRITE: POSITIVE — AB
PH: 5 (ref 5.0–8.0)
Protein, ur: 30 mg/dL — AB
SPECIFIC GRAVITY, URINE: 1.012 (ref 1.005–1.030)

## 2017-07-23 LAB — CBC
HCT: 37.3 % (ref 36.0–46.0)
HEMOGLOBIN: 12.7 g/dL (ref 12.0–15.0)
MCH: 30.8 pg (ref 26.0–34.0)
MCHC: 34 g/dL (ref 30.0–36.0)
MCV: 90.5 fL (ref 78.0–100.0)
PLATELETS: 224 10*3/uL (ref 150–400)
RBC: 4.12 MIL/uL (ref 3.87–5.11)
RDW: 12.6 % (ref 11.5–15.5)
WBC: 11.6 10*3/uL — AB (ref 4.0–10.5)

## 2017-07-23 LAB — BASIC METABOLIC PANEL
Anion gap: 10 (ref 5–15)
BUN: 7 mg/dL (ref 6–20)
CHLORIDE: 94 mmol/L — AB (ref 101–111)
CO2: 25 mmol/L (ref 22–32)
Calcium: 8.5 mg/dL — ABNORMAL LOW (ref 8.9–10.3)
Creatinine, Ser: 0.88 mg/dL (ref 0.44–1.00)
Glucose, Bld: 145 mg/dL — ABNORMAL HIGH (ref 65–99)
POTASSIUM: 3.6 mmol/L (ref 3.5–5.1)
SODIUM: 129 mmol/L — AB (ref 135–145)

## 2017-07-23 LAB — I-STAT CG4 LACTIC ACID, ED: LACTIC ACID, VENOUS: 2.07 mmol/L — AB (ref 0.5–1.9)

## 2017-07-23 NOTE — ED Notes (Signed)
Pt has had fever since Saturday with HA, neck soreness. [facility] to come here if things worsened. Today pt became nauseous and threw up, this is what prompted her to come to ED.

## 2017-07-24 DIAGNOSIS — N390 Urinary tract infection, site not specified: Secondary | ICD-10-CM | POA: Diagnosis not present

## 2017-07-24 DIAGNOSIS — Z79899 Other long term (current) drug therapy: Secondary | ICD-10-CM | POA: Diagnosis not present

## 2017-07-24 DIAGNOSIS — R509 Fever, unspecified: Secondary | ICD-10-CM | POA: Diagnosis not present

## 2017-07-24 LAB — I-STAT CG4 LACTIC ACID, ED: Lactic Acid, Venous: 0.93 mmol/L (ref 0.5–1.9)

## 2017-07-24 MED ORDER — SODIUM CHLORIDE 0.9 % IV BOLUS (SEPSIS)
1000.0000 mL | Freq: Once | INTRAVENOUS | Status: AC
  Administered 2017-07-24: 1000 mL via INTRAVENOUS

## 2017-07-24 MED ORDER — CIPROFLOXACIN HCL 500 MG PO TABS: 500.0000 mg | ORAL_TABLET | Freq: Two times a day (BID) | ORAL | 0 refills | Status: AC

## 2017-07-24 MED ORDER — DEXTROSE 5 % IV SOLN
1.0000 g | Freq: Once | INTRAVENOUS | Status: AC
  Administered 2017-07-24: 1 g via INTRAVENOUS
  Filled 2017-07-24: qty 10

## 2017-07-24 MED ORDER — KETOROLAC TROMETHAMINE 30 MG/ML IJ SOLN
30.0000 mg | Freq: Once | INTRAMUSCULAR | Status: AC
  Administered 2017-07-24: 30 mg via INTRAVENOUS
  Filled 2017-07-24: qty 1

## 2017-07-24 NOTE — ED Provider Notes (Signed)
[facility]-EMERGENCY DEPT Provider Note   CSN: [id number] Arrival date & time: 07/23/17  2157     History   Chief Complaint Chief Complaint  Patient presents with  . Fever  . Headache    HPI Crystal Gray is a 36 y.o. female.  HPI  36 y.o. female, presents to the Emergency Department today due to fever since Saturday. Notes headache intermittently with spikes in fever. Tylenol as needed for fever with relief of headache. Pt was told my [facility] that she saw yesterday to return to ED if symptoms worsen. Pt notes nausea with one episode of emesis. No CP/SOB. No cough. Notes lower abdominal discomfort with back pain. States chills at night. Rates pain 6/10. Throbbing. Noted taking tylenol PTA. Pt otherwise health with no chronic medical problems. Denies neck stiffness. No other symptoms noted.    Past Medical History:  Diagnosis Date  . Depression   . No pertinent past medical history     Patient Active Problem List   Diagnosis Date Noted  . Left tibia and fibula fracture at the junction of middle and distal thirds. 07/13/2013  . Stress fracture of tibia 05/07/2012    Past Surgical History:  Procedure Laterality Date  . FRACTURE SURGERY    . TIBIA IM NAIL INSERTION Left 07/13/2013   Procedure: INTRAMEDULLARY (IM) NAIL TIBIAL;  Surgeon: Nestor Lewandowsky, MD;  Location: [facility] OR;  Service: Orthopedics;  Laterality: Left;  . WISDOM TOOTH EXTRACTION      OB History    Gravida Para Term Preterm AB Living   4 3 3  0 0 4   SAB TAB Ectopic Multiple Live Births   0 0 0 1 2       Home Medications    Prior to Admission medications   Medication Sig Start Date End Date Taking? Authorizing Provider  cetirizine-pseudoephedrine (ZYRTEC-D) 5-120 MG tablet Take 1 tablet by mouth daily. 07/22/17   Cathie Hoops, Amy V, PA-C  ipratropium (ATROVENT) 0.06 % nasal spray Place 2 sprays into both nostrils 4 (four) times daily. 07/22/17   Belinda Fisher, PA-C  levonorgestrel (MIRENA) 20 MCG/24HR IUD 1 each by  Intrauterine route once.    [provider]    Family History Family History  Problem Relation Age of Onset  . Cancer Maternal Grandfather   . Heart disease Maternal Grandfather     Social History Social History  Substance Use Topics  . Smoking status: Never Smoker  . Smokeless tobacco: Never Used  . Alcohol use No     Allergies   Patient has no known allergies.   Review of Systems Review of Systems ROS reviewed and all are negative for acute change except as noted in the HPI.  Physical Exam Updated Vital Signs BP 127/85   Pulse 93   Temp 98.8 F (37.1 C) (Oral)   Resp 16   Ht 5\' 8"  (1.727 m)   Wt 70.3 kg (155 lb)   SpO2 100%   BMI 23.57 kg/m   Physical Exam  Constitutional: She is oriented to person, place, and time. Vital signs are normal. She appears well-developed and well-nourished. No distress.  NAD. Sitting upright in bed. Conversing appropriately   HENT:  Head: Normocephalic and atraumatic.  Right Ear: Hearing, tympanic membrane, external ear and ear canal normal.  Left Ear: Hearing, tympanic membrane, external ear and ear canal normal.  Nose: Nose normal.  Mouth/Throat: Uvula is midline, oropharynx is clear and moist and mucous membranes are normal. No trismus in  the jaw. No oropharyngeal exudate, posterior oropharyngeal erythema or tonsillar abscesses.  Pt with full ROM of neck without difficulty. No nuchal rigidity  Eyes: Pupils are equal, round, and reactive to light. Conjunctivae and EOM are normal.  Neck: Normal range of motion. Neck supple. No tracheal deviation present.  Cardiovascular: Normal rate, regular rhythm, S1 normal, S2 normal, normal heart sounds, intact distal pulses and normal pulses.   Pulmonary/Chest: Effort normal and breath sounds normal. No respiratory distress. She has no decreased breath sounds. She has no wheezes. She has no rhonchi. She has no rales.  Abdominal: Soft. Normal appearance and bowel sounds are normal.  There is tenderness in the suprapubic area.  Musculoskeletal: Normal range of motion.  Neurological: She is alert and oriented to person, place, and time.  Skin: Skin is warm and dry.  Psychiatric: She has a normal mood and affect. Her speech is normal and behavior is normal. Thought content normal.  Nursing note and vitals reviewed.  ED Treatments / Results  Labs (all labs ordered are listed, but only abnormal results are displayed) Labs Reviewed  CBC - Abnormal; Notable for the following:       Result Value   WBC 11.6 (*)    All other components within normal limits  BASIC METABOLIC PANEL - Abnormal; Notable for the following:    Sodium 129 (*)    Chloride 94 (*)    Glucose, Bld 145 (*)    Calcium 8.5 (*)    All other components within normal limits  URINALYSIS, ROUTINE W REFLEX MICROSCOPIC - Abnormal; Notable for the following:    APPearance HAZY (*)    Hgb urine dipstick MODERATE (*)    Ketones, ur 20 (*)    Protein, ur 30 (*)    Nitrite POSITIVE (*)    Leukocytes, UA LARGE (*)    Bacteria, UA MANY (*)    Squamous Epithelial / LPF 0-5 (*)    All other components within normal limits  I-STAT CG4 LACTIC ACID, ED - Abnormal; Notable for the following:    Lactic Acid, Venous 2.07 (*)    All other components within normal limits    EKG  EKG Interpretation None       Radiology No results found.  Procedures Procedures (including critical care time)  Medications Ordered in ED Medications  cefTRIAXone (ROCEPHIN) 1 g in dextrose 5 % 50 mL IVPB (not administered)  sodium chloride 0.9 % bolus 1,000 mL (not administered)  ketorolac (TORADOL) 30 MG/ML injection 30 mg (not administered)     Initial Impression / Assessment and Plan / ED Course  I have reviewed the triage vital signs and the nursing notes.  Pertinent labs & imaging results that were available during my care of the patient were reviewed by me and considered in my medical decision making (see chart for  details).  Final Clinical Impressions(s) / ED Diagnoses  {I have reviewed and evaluated the relevant laboratory values.   {I have reviewed the relevant previous healthcare records.  {I obtained HPI from historian.   ED Course:  Assessment: Pt is a 36 y.o. female presents to the Emergency Department today due to fever since Saturday. Notes headache intermittently with spikes in fever. Tylenol as needed for fever with relief of headache. Pt was told my [facility] that she saw yesterday to return to ED if symptoms worsen. Pt notes nausea with one episode of emesis. No CP/SOB. No cough. Notes lower abdominal discomfort with back pain. States chills at  night. Rates pain 6/10. Throbbing. Noted taking tylenol PTA. Pt otherwise health with no chronic medical problems. Denies neck stiffness.  On exam, pt in NAD. Nontoxic/nonseptic appearing. VS with tachycardia. Temp 101.25F. Lungs CTA. Heart RRR. Abdomen mild TTP suprapubic. No CVA tenderness. CBC with leukocytosis 11.6.. Lactic 2.07. Sodium 129. UA with evidence of UTI. Culture sent. Will treat with Rocephin. Will treat as outpatient for clinical pyelo given back pain and chills. Doubt meningitis given presentation. Pt with full ROM of neck without difficulty. Neg Kernig/Brudzinski. Plan is to DC home with follow up to PCP. At time of discharge, Patient is in no acute distress. Vital Signs are stable. Patient is able to ambulate. Patient able to tolerate PO.   Disposition/Plan:  DC Home Additional Verbal discharge instructions given and discussed with patient.  Pt Instructed to f/u with PCP in the next week for evaluation and treatment of symptoms. Return precautions given Pt acknowledges and agrees with plan  Supervising Physician Zadie RhineWickline, Donald, MD  Final diagnoses:  Urinary tract infection without hematuria, site unspecified    New Prescriptions New Prescriptions   No medications on file     Audry PiliMohr, Shakia Sebastiano, Cordelia Poche-C 07/24/17 0013    Zadie RhineWickline, Donald,  MD 07/24/17 [phone]

## 2017-07-24 NOTE — ED Notes (Signed)
Pt departed in NAD, refused use of wheelchair.  

## 2017-07-24 NOTE — Discharge Instructions (Signed)
Please read and follow all provided instructions.  Your diagnoses today include:  1. Pyelonephritis     Tests performed today include: Vital signs. See below for your results today.   Medications prescribed:  Take as prescribed   Home care instructions:  Follow any educational materials contained in this packet.  Follow-up instructions: Please follow-up with your primary care provider for further evaluation of symptoms and treatment   Return instructions:  Please return to the Emergency Department if you do not get better, if you get worse, or new symptoms OR  - Fever (temperature greater than 101.38F)  - Bleeding that does not stop with holding pressure to the area    -Severe pain (please note that you may be more sore the day after your accident)  - Chest Pain  - Difficulty breathing  - Severe nausea or vomiting  - Inability to tolerate food and liquids  - Passing out  - Skin becoming red around your wounds  - Change in mental status (confusion or lethargy)  - New numbness or weakness    Please return if you have any other emergent concerns.  Additional Information:  Your vital signs today were: BP 127/85    Pulse 93    Temp 98.8 F (37.1 C) (Oral)    Resp 16    Ht 5\' 8"  (1.727 m)    Wt 70.3 kg (155 lb)    SpO2 100%    BMI 23.57 kg/m  If your blood pressure (BP) was elevated above 135/85 this visit, please have this repeated by your doctor within one month. ---------------

## 2017-07-26 LAB — URINE CULTURE: Culture: >=100000 — AB

## 2017-07-27 ENCOUNTER — Telehealth: Payer: Self-pay

## 2017-07-27 NOTE — Telephone Encounter (Signed)
Post ED Visit - Positive Culture Follow-up  Culture report reviewed by antimicrobial stewardship pharmacist:  []  Enzo BiNathan Batchelder, Pharm.D. []  Celedonio MiyamotoJeremy Frens, [address]Ph[address] BCPS AQ-ID []  Garvin FilaMike Maccia, Pharm.D., BCPS []  Georgina PillionElizabeth Martin, Pharm.D., BCPS []  WestlakeMinh Pham, [address] BCPS, AAHIVP []  Estella HuskMichelle Turner, Pharm.D., BCPS, AAHIVP [x]  Lysle Pearlachel Rumbarger, PharmD, BCPS []  Casilda Carlsaylor Stone, PharmD, BCPS []  Pollyann SamplesAndy Johnston, PharmD, BCPS  Positive urine culture Treated with Cipro, organism sensitive to the same and no further patient follow-up is required at this time.  Jerry CarasCullom, Alli Jasmer Burnett 07/27/2017, 9:59 AM

## 2017-08-31 DIAGNOSIS — R300 Dysuria: Secondary | ICD-10-CM | POA: Diagnosis not present

## 2018-01-18 DIAGNOSIS — R300 Dysuria: Secondary | ICD-10-CM | POA: Diagnosis not present

## 2018-01-24 DIAGNOSIS — Z124 Encounter for screening for malignant neoplasm of cervix: Secondary | ICD-10-CM | POA: Diagnosis not present

## 2018-01-24 DIAGNOSIS — Z01419 Encounter for gynecological examination (general) (routine) without abnormal findings: Secondary | ICD-10-CM | POA: Diagnosis not present

## 2018-01-24 DIAGNOSIS — R87610 Atypical squamous cells of undetermined significance on cytologic smear of cervix (ASC-US): Secondary | ICD-10-CM | POA: Diagnosis not present

## 2018-02-02 DIAGNOSIS — R319 Hematuria, unspecified: Secondary | ICD-10-CM | POA: Diagnosis not present

## 2018-02-02 DIAGNOSIS — N390 Urinary tract infection, site not specified: Secondary | ICD-10-CM | POA: Diagnosis not present

## 2018-02-23 ENCOUNTER — Ambulatory Visit (HOSPITAL_COMMUNITY)
Admission: EM | Admit: 2018-02-23 | Discharge: 2018-02-23 | Disposition: A | Discharge status: Home / Self Care | Payer: 59 | Attending: Internal Medicine | Admitting: Internal Medicine

## 2018-02-23 ENCOUNTER — Other Ambulatory Visit: Payer: Self-pay

## 2018-02-23 ENCOUNTER — Encounter (HOSPITAL_COMMUNITY): Payer: Self-pay | Admitting: *Deleted

## 2018-02-23 DIAGNOSIS — J029 Acute pharyngitis, unspecified: Secondary | ICD-10-CM | POA: Diagnosis not present

## 2018-02-23 DIAGNOSIS — F329 Major depressive disorder, single episode, unspecified: Secondary | ICD-10-CM | POA: Diagnosis not present

## 2018-02-23 DIAGNOSIS — Z3202 Encounter for pregnancy test, result negative: Secondary | ICD-10-CM | POA: Diagnosis not present

## 2018-02-23 LAB — POCT RAPID STREP A: Streptococcus, Group A Screen (Direct): NEGATIVE

## 2018-02-23 NOTE — ED Triage Notes (Signed)
C/O starting with slight sore throat 2 days ago with worsening yesterday.  No known fevers. Concerned if strep throat.

## 2018-02-23 NOTE — Discharge Instructions (Signed)
Rapid strep negative. Pregnancy test negative. You can take over the counter flonase and/or Zyrtec-D for nasal congestion/drainage. You can use over the counter nasal saline rinse such as neti pot for nasal congestion. Monitor for any worsening of symptoms, swelling of the throat, trouble breathing, trouble swallowing, follow up for reevaluation.   For sore throat try using a honey-based tea. Use 3 teaspoons of honey with juice squeezed from half lemon. Place shaved pieces of ginger into 1/2-1 cup of water and warm over stove top. Then mix the ingredients and repeat every 4 hours as needed.

## 2018-02-23 NOTE — ED Provider Notes (Signed)
[facility]    CSN: [id number] Arrival date & time: 02/23/18  1304     History   Chief Complaint Chief Complaint  Patient presents with  . Sore Throat    HPI Crystal Gray is a 37 y.o. female.   37 year old female comes in for 2-day history of URI symptoms.  Has had slight sore throat that worsened yesterday.  Denies rhinorrhea, nasal congestion, cough.  Denies fever, chills, night sweats.  OTC medications without relief.  Is a concern for strep.  States has family members with strep, but symptoms started prior to their arrival.      Past Medical History:  Diagnosis Date  . Depression     Patient Active Problem List   Diagnosis Date Noted  . Left tibia and fibula fracture at the junction of middle and distal thirds. 07/13/2013  . Stress fracture of tibia 05/07/2012    Past Surgical History:  Procedure Laterality Date  . FRACTURE SURGERY    . TIBIA IM NAIL INSERTION Left 07/13/2013   Procedure: INTRAMEDULLARY (IM) NAIL TIBIAL;  Surgeon: Nestor Lewandowsky, MD;  Location: [facility];  Service: Orthopedics;  Laterality: Left;  . WISDOM TOOTH EXTRACTION      OB History    Gravida Para Term Preterm AB Living   4 3 3  0 0 4   SAB TAB Ectopic Multiple Live Births   0 0 0 1 2       Home Medications    Prior to Admission medications   Medication Sig Start Date End Date Taking? Authorizing Provider  UNKNOWN TO PATIENT BCPs   Yes [provider]    Family History Family History  Problem Relation Age of Onset  . Cancer Maternal Grandfather   . Heart disease Maternal Grandfather     Social History Social History   Tobacco Use  . Smoking status: Never Smoker  . Smokeless tobacco: Never Used  Substance Use Topics  . Alcohol use: Yes    Comment: occasionally  . Drug use: No     Allergies   Patient has no known allergies.   Review of Systems Review of Systems  Reason unable to perform ROS: See HPI as above.     Physical  Exam Triage Vital Signs ED Triage Vitals  Enc Vitals Group     BP 02/23/18 1421 (!) 147/69     Pulse Rate 02/23/18 1421 (!) 58     Resp 02/23/18 1421 16     Temp 02/23/18 1421 (!) 97.5 F (36.4 C)     Temp Source 02/23/18 1421 Oral     SpO2 02/23/18 1421 100 %     Weight --      Height --      Head Circumference --      Peak Flow --      Pain Score 02/23/18 1422 3     Pain Loc --      Pain Edu? --      Excl. in GC? --    No data found.  Updated Vital Signs BP (!) 147/69   Pulse (!) 58   Temp (!) 97.5 F (36.4 C) (Oral)   Resp 16   LMP 01/23/2018 (Approximate)   SpO2 100%   Physical Exam  Constitutional: She is oriented to person, place, and time. She appears well-developed and well-nourished. No distress.  HENT:  Head: Normocephalic and atraumatic.  Right Ear: Tympanic membrane, external ear and ear canal normal. Tympanic membrane is not erythematous  and not bulging.  Left Ear: Tympanic membrane, external ear and ear canal normal. Tympanic membrane is not erythematous and not bulging.  Nose: Nose normal. Right sinus exhibits no maxillary sinus tenderness and no frontal sinus tenderness. Left sinus exhibits no maxillary sinus tenderness and no frontal sinus tenderness.  Mouth/Throat: Uvula is midline, oropharynx is clear and moist and mucous membranes are normal. No tonsillar exudate.  Eyes: Conjunctivae are normal. Pupils are equal, round, and reactive to light.  Neck: Normal range of motion. Neck supple.  Cardiovascular: Normal rate, regular rhythm and normal heart sounds. Exam reveals no gallop and no friction rub.  No murmur heard. Pulmonary/Chest: Effort normal and breath sounds normal. She has no decreased breath sounds. She has no wheezes. She has no rhonchi. She has no rales.  Lymphadenopathy:    She has no cervical adenopathy.  Neurological: She is alert and oriented to person, place, and time.  Skin: Skin is warm and dry.  Psychiatric: She has a normal mood  and affect. Her behavior is normal. Judgment normal.    UC Treatments / Results  Labs (all labs ordered are listed, but only abnormal results are displayed) Labs Reviewed  CULTURE, GROUP A STREP [facility]  POCT RAPID STREP A    EKG  EKG Interpretation None       Radiology No results found.  Procedures Procedures (including critical care time)  Medications Ordered in UC Medications - No data to display   Initial Impression / Assessment and Plan / UC Course  I have reviewed the triage vital signs and the nursing notes.  Pertinent labs & imaging results that were available during my care of the patient were reviewed by me and considered in my medical decision making (see chart for details).    Rapid strep negative. Symptomatic treatment as needed. Return precautions given.   Final Clinical Impressions(s) / UC Diagnoses   Final diagnoses:  Pharyngitis, unspecified etiology    ED Discharge Orders    None        Belinda FisherYu, Yaeko Fazekas V, PA-C 02/23/18 1451

## 2018-02-24 LAB — POCT PREGNANCY, URINE: Preg Test, Ur: NEGATIVE

## 2018-02-26 LAB — CULTURE, GROUP A STREP (THRC)

## 2018-03-01 ENCOUNTER — Emergency Department (HOSPITAL_BASED_OUTPATIENT_CLINIC_OR_DEPARTMENT_OTHER)
Admission: EM | Admit: 2018-03-01 | Discharge: 2018-03-01 | Disposition: A | Discharge status: Home / Self Care | Payer: 59 | Attending: Emergency Medicine | Admitting: Emergency Medicine

## 2018-03-01 ENCOUNTER — Emergency Department (HOSPITAL_BASED_OUTPATIENT_CLINIC_OR_DEPARTMENT_OTHER): Payer: 59

## 2018-03-01 ENCOUNTER — Encounter (HOSPITAL_BASED_OUTPATIENT_CLINIC_OR_DEPARTMENT_OTHER): Payer: Self-pay | Admitting: Emergency Medicine

## 2018-03-01 ENCOUNTER — Other Ambulatory Visit: Payer: Self-pay

## 2018-03-01 DIAGNOSIS — Y999 Unspecified external cause status: Secondary | ICD-10-CM | POA: Diagnosis not present

## 2018-03-01 DIAGNOSIS — W230XXA Caught, crushed, jammed, or pinched between moving objects, initial encounter: Secondary | ICD-10-CM | POA: Insufficient documentation

## 2018-03-01 DIAGNOSIS — S61210A Laceration without foreign body of right index finger without damage to nail, initial encounter: Secondary | ICD-10-CM | POA: Diagnosis not present

## 2018-03-01 DIAGNOSIS — Z23 Encounter for immunization: Secondary | ICD-10-CM | POA: Diagnosis not present

## 2018-03-01 DIAGNOSIS — S6710XA Crushing injury of unspecified finger(s), initial encounter: Secondary | ICD-10-CM

## 2018-03-01 DIAGNOSIS — S67190A Crushing injury of right index finger, initial encounter: Secondary | ICD-10-CM | POA: Diagnosis not present

## 2018-03-01 DIAGNOSIS — Y9389 Activity, other specified: Secondary | ICD-10-CM | POA: Insufficient documentation

## 2018-03-01 DIAGNOSIS — Y929 Unspecified place or not applicable: Secondary | ICD-10-CM | POA: Insufficient documentation

## 2018-03-01 DIAGNOSIS — S6991XA Unspecified injury of right wrist, hand and finger(s), initial encounter: Secondary | ICD-10-CM | POA: Diagnosis present

## 2018-03-01 DIAGNOSIS — S60410A Abrasion of right index finger, initial encounter: Secondary | ICD-10-CM | POA: Diagnosis not present

## 2018-03-01 MED ORDER — TETANUS-DIPHTH-ACELL PERTUSSIS 5-2.5-18.5 LF-MCG/0.5 IM SUSP
0.5000 mL | Freq: Once | INTRAMUSCULAR | Status: AC
  Administered 2018-03-01: 0.5 mL via INTRAMUSCULAR
  Filled 2018-03-01: qty 0.5

## 2018-03-01 NOTE — ED Triage Notes (Signed)
Pt hit R index finger with a sledgehammer at the gym. Bleeding noted around the fingernail.

## 2018-03-01 NOTE — Discharge Instructions (Signed)
Apply antibiotic ointment to your finger and change the dressing 1-2 times daily.  Wear splint as needed for protection.  You can take ibuprofen or Tylenol as prescribed over-the-counter, as needed for your pain.  Use ice 3-4 times daily alternating 10 minutes on, 10 minutes off.  Please follow-up with a hand doctor below if you continue to have pain after 7-10days including fever, increasing pain, redness, swelling, red streaking.  Please return to emergency department if you develop any new or worsening symptoms appear hand, or drainage.

## 2018-03-02 NOTE — ED Provider Notes (Signed)
[facility] EMERGENCY DEPARTMENT Provider Note   CSN: [id number] Arrival date & time: 03/01/18  1254     History   Chief Complaint Chief Complaint  Patient presents with  . Finger Injury    HPI Crystal Gray is a 37 y.o. female who presents with right index finger pain after hitting it with a sledgehammer.  Patient was working out at [employer] gym doing an exercise with a sledgehammer and a tire when her finger got caught between.  Patient denies any numbness or tingling.  She had some mild bleeding.  Her tetanus is not up-to-date.  She did not try any interventions prior to arrival.  She denies any other pain besides the tip of her right index finger and she states it is improving since it happened.  HPI  Past Medical History:  Diagnosis Date  . Depression     Patient Active Problem List   Diagnosis Date Noted  . Left tibia and fibula fracture at the junction of middle and distal thirds. 07/13/2013  . Stress fracture of tibia 05/07/2012    Past Surgical History:  Procedure Laterality Date  . FRACTURE SURGERY    . TIBIA IM NAIL INSERTION Left 07/13/2013   Procedure: INTRAMEDULLARY (IM) NAIL TIBIAL;  Surgeon: Nestor LewandowskyFrank J Rowan, MD;  Location: [facility];  Service: Orthopedics;  Laterality: Left;  . WISDOM TOOTH EXTRACTION      OB History    Gravida Para Term Preterm AB Living   4 3 3  0 0 4   SAB TAB Ectopic Multiple Live Births   0 0 0 1 2       Home Medications    Prior to Admission medications   Medication Sig Start Date End Date Taking? Authorizing Provider  UNKNOWN TO PATIENT BCPs    [provider]    Family History Family History  Problem Relation Age of Onset  . Cancer Maternal Grandfather   . Heart disease Maternal Grandfather     Social History Social History   Tobacco Use  . Smoking status: Never Smoker  . Smokeless tobacco: Never Used  Substance Use Topics  . Alcohol use: Yes    Comment: occasionally  . Drug use: No  Tetanus  is not up-to-date.   Allergies   Patient has no known allergies.   Review of Systems Review of Systems  Skin: Positive for wound.  Neurological: Negative for numbness.     Physical Exam Updated Vital Signs BP 125/78 (BP Location: Left Arm)   Pulse 92   Temp 98.3 F (36.8 C) (Oral)   Resp 16   Ht 5\' 8"  (1.727 m)   Wt 72.6 kg (160 lb)   LMP 01/17/2018   SpO2 100%   BMI 24.33 kg/m   Physical Exam  Constitutional: She is oriented to person, place, and time. She appears well-developed and well-nourished. No distress.  HENT:  Head: Normocephalic and atraumatic.  Mouth/Throat: Oropharynx is clear and moist.  Eyes: Conjunctivae are normal. Right eye exhibits no discharge. Left eye exhibits no discharge. No scleral icterus.  Cardiovascular: Normal rate, regular rhythm, normal heart sounds and intact distal pulses.  Pulmonary/Chest: Effort normal and breath sounds normal. No respiratory distress. She has no wheezes. She has no rales.  Musculoskeletal:  R index finger: tenderness to the tip, no DIP or PIP tenderness or ROM deficits, very superficial lifting of skin along the ulnar aspect of the nail, <1cm in length, nail and nailbed are completely intact, no subungal hematoma  Neurological: She is alert and oriented to person, place, and time. Coordination normal.  Skin: Skin is warm and dry. No rash noted. She is not diaphoretic. No pallor.  Psychiatric: She has a normal mood and affect. Her behavior is normal. Judgment and thought content normal.  Nursing note and vitals reviewed.    ED Treatments / Results  Labs (all labs ordered are listed, but only abnormal results are displayed) Labs Reviewed - No data to display  EKG  EKG Interpretation None       Radiology Dg Finger Index Right  Result Date: 03/01/2018 CLINICAL DATA:  hitting a tire w/a sledge hammer at the gym this morning and caught her Rt index finger between them. Has pain/redness/abrasion in distal phalanx  of Rt index fingerPatient unsure if she is pregnant. Patient was fine with proceeding with exam and was shielded for study EXAM: RIGHT INDEX FINGER 2+V COMPARISON:  None. FINDINGS: There is no evidence of fracture or dislocation. There is no evidence of arthropathy or other focal bone abnormality. Soft tissues are unremarkable. IMPRESSION: Negative. Electronically Signed   By: Corlis Leak M.D.   On: 03/01/2018 13:17    Procedures Procedures (including critical care time)  Medications Ordered in ED Medications  Tdap (BOOSTRIX) injection 0.5 mL (0.5 mLs Intramuscular Given 03/01/18 1457)     Initial Impression / Assessment and Plan / ED Course  I have reviewed the triage vital signs and the nursing notes.  Pertinent labs & imaging results that were available during my care of the patient were reviewed by me and considered in my medical decision making (see chart for details).     Patient with crush injury of right index finger.  X-rays negative.  Nail and nailbed are intact.  There is superficial abrasion/laceration without bleeding which is not amenable to repair. Tdap updated in the ED. Wound care discussed and dressing applied in the ED. Return precautions discussed.  Patient understands and agrees with plan.  Patient vitals stable throughout ED course and discharged in satisfactory condition.  Final Clinical Impressions(s) / ED Diagnoses   Final diagnoses:  Crushing injury of finger, initial encounter  Laceration of right index finger without foreign body without damage to nail, initial encounter    ED Discharge Orders    None       Emi Holes, PA-C 03/02/18 2344    Vanetta Mulders, MD 03/05/18 1538

## 2018-05-09 ENCOUNTER — Encounter: Payer: Self-pay | Admitting: Family Medicine

## 2018-05-14 ENCOUNTER — Encounter: Payer: Self-pay | Admitting: Family Medicine

## 2018-06-01 IMAGING — DX DG FINGER INDEX 2+V*R*
3 series · 3 of 3 positions shown · non-contrast
Comparison: None.

CLINICAL DATA: hitting a tire w/a sledge hammer at the gym this
morning and caught her Rt index finger between them. Has
pain/redness/abrasion in distal phalanx of Rt index fingerPatient
unsure if she is pregnant. Patient was fine with proceeding with
exam and was shielded for study

EXAM:
RIGHT INDEX FINGER 2+V

[finger ap]
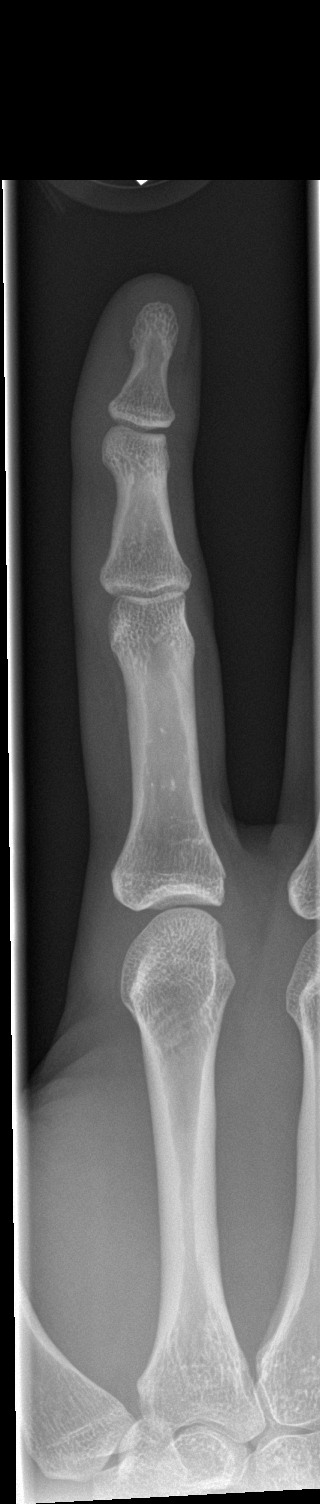

[finger obl]
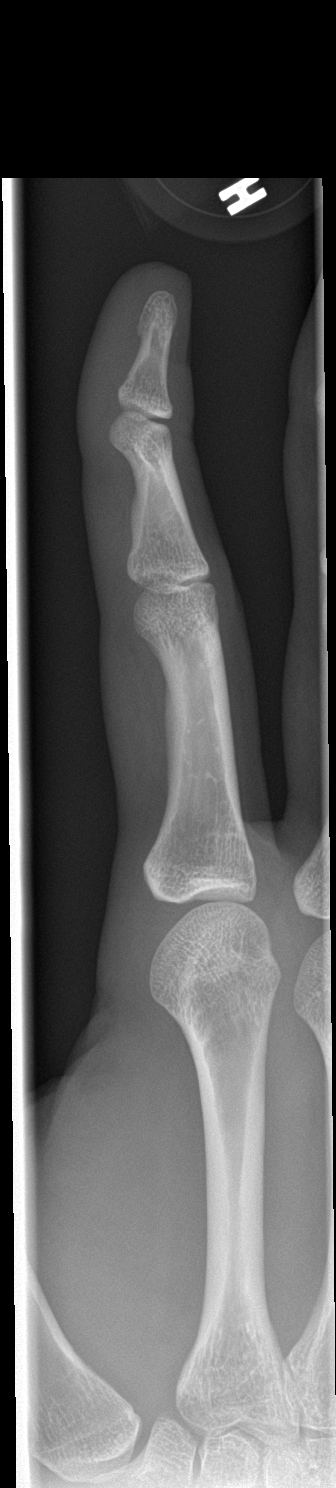

[finger lat]
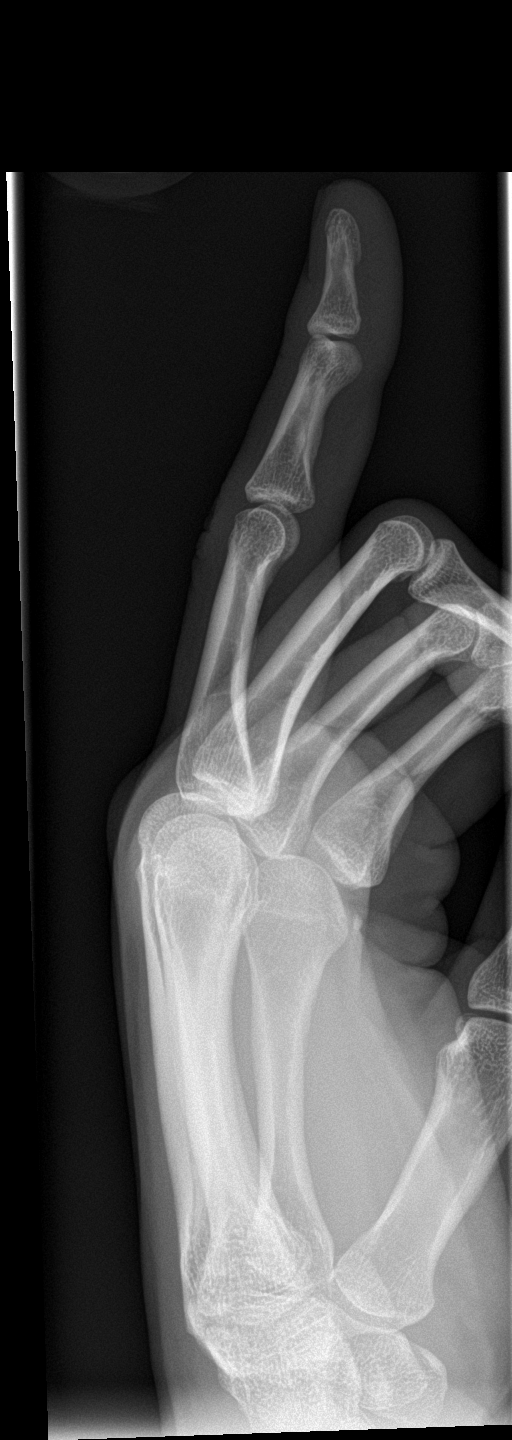

[3 of 3 positions shown; findings below may reference images not displayed]

FINDINGS: There is no evidence of fracture or dislocation. There is no
evidence of arthropathy or other focal bone abnormality. Soft
tissues are unremarkable.
IMPRESSION: Negative.

## 2018-06-28 ENCOUNTER — Other Ambulatory Visit: Payer: Self-pay

## 2018-06-28 ENCOUNTER — Encounter (HOSPITAL_COMMUNITY): Payer: Self-pay | Admitting: Emergency Medicine

## 2018-06-28 ENCOUNTER — Ambulatory Visit (HOSPITAL_COMMUNITY)
Admission: EM | Admit: 2018-06-28 | Discharge: 2018-06-28 | Disposition: A | Discharge status: Home / Self Care | Payer: 59 | Attending: Family Medicine | Admitting: Family Medicine

## 2018-06-28 DIAGNOSIS — Z79899 Other long term (current) drug therapy: Secondary | ICD-10-CM | POA: Insufficient documentation

## 2018-06-28 DIAGNOSIS — N3001 Acute cystitis with hematuria: Secondary | ICD-10-CM | POA: Diagnosis not present

## 2018-06-28 DIAGNOSIS — Z8249 Family history of ischemic heart disease and other diseases of the circulatory system: Secondary | ICD-10-CM | POA: Diagnosis not present

## 2018-06-28 DIAGNOSIS — Z809 Family history of malignant neoplasm, unspecified: Secondary | ICD-10-CM | POA: Diagnosis not present

## 2018-06-28 DIAGNOSIS — Z9889 Other specified postprocedural states: Secondary | ICD-10-CM | POA: Diagnosis not present

## 2018-06-28 DIAGNOSIS — R109 Unspecified abdominal pain: Secondary | ICD-10-CM | POA: Diagnosis not present

## 2018-06-28 LAB — POCT URINALYSIS DIP (DEVICE)
Bilirubin Urine: NEGATIVE
Glucose, UA: NEGATIVE mg/dL
NITRITE: POSITIVE — AB
PROTEIN: NEGATIVE mg/dL
Specific Gravity, Urine: 1.015 (ref 1.005–1.030)
UROBILINOGEN UA: 0.2 mg/dL (ref 0.0–1.0)
pH: 6 (ref 5.0–8.0)

## 2018-06-28 MED ORDER — CEPHALEXIN 500 MG PO CAPS: 500.0000 mg | ORAL_CAPSULE | Freq: Two times a day (BID) | ORAL | 0 refills | Status: AC

## 2018-06-28 NOTE — ED Triage Notes (Signed)
[facility] a [facility] dysuria, cloudy urine, body aches and a headache x 1 month.

## 2018-06-28 NOTE — Discharge Instructions (Addendum)
Urine culture sent.  We will call you with the results.   °Push fluids and get plenty of rest.   °Take antibiotic as directed and to completion °Continue OTC medications as needed for symptomatic relief °Follow up with PCP if symptoms persists °Return here or go to ER if you have any new or worsening symptoms such as fever, worsening abdominal pain, nausea/vomiting, flank pain, etc... °

## 2018-06-28 NOTE — ED Notes (Signed)
Sent patient to obtain a clean and dirty urine specimen

## 2018-06-28 NOTE — ED Provider Notes (Signed)
[facility]   SUBJECTIVE:  Crystal GreeningChristina Gray is a 37 y.o. female who complains of urine cloudiness and strong urine odor for 1 month.  Patient admits to not urinating after sex around the time of her symptoms.  Complains of associated flank pain.  Pain is constant and describes it as achy.  Has tried OTC medications including ibuprofen with temporary relief.  Symptoms are made worse with movement.  Admits to similar symptoms in the past and diagnosed with a kidney infection.  Complains of subjective fever, chills, and fatigue.  Denies nausea, vomiting, abnormal vaginal discharge or bleeding, or hematuria.    LMP: No LMP recorded. (Menstrual status: Irregular Periods).  ROS: As in HPI.  Past Medical History:  Diagnosis Date  . Depression    Past Surgical History:  Procedure Laterality Date  . FRACTURE SURGERY    . TIBIA IM NAIL INSERTION Left 07/13/2013   Procedure: INTRAMEDULLARY (IM) NAIL TIBIAL;  Surgeon: Nestor LewandowskyFrank J Rowan, MD;  Location: [facility];  Service: Orthopedics;  Laterality: Left;  . WISDOM TOOTH EXTRACTION     No Known Allergies No current facility-administered medications on file prior to encounter.    Current Outpatient Medications on File Prior to Encounter  Medication Sig Dispense Refill  . UNKNOWN TO PATIENT BCPs     Social History   Socioeconomic History  . Marital status: Married    Spouse name: Not on file  . Number of children: Not on file  . Years of education: Not on file  . Highest education level: Not on file  Occupational History  . Not on file  Social Needs  . Financial resource strain: Not on file  . Food insecurity:    Worry: Not on file    Inability: Not on file  . Transportation needs:    Medical: Not on file    Non-medical: Not on file  Tobacco Use  . Smoking status: Never Smoker  . Smokeless tobacco: Never Used  Substance and Sexual Activity  . Alcohol use: Yes    Comment: occasionally  . Drug use: No  . Sexual activity:  Not on file  Lifestyle  . Physical activity:    Days per week: Not on file    Minutes per session: Not on file  . Stress: Not on file  Relationships  . Social connections:    Talks on phone: Not on file    Gets together: Not on file    Attends religious service: Not on file    Active member of club or organization: Not on file    Attends meetings of clubs or organizations: Not on file    Relationship status: Not on file  . Intimate partner violence:    Fear of current or ex partner: Not on file    Emotionally abused: Not on file    Physically abused: Not on file    Forced sexual activity: Not on file  Other Topics Concern  . Not on file  Social History Narrative  . Not on file   Family History  Problem Relation Age of Onset  . Cancer Maternal Grandfather   . Heart disease Maternal Grandfather     OBJECTIVE:  Vitals:   06/28/18 1229  BP: 123/89  Pulse: 93  Resp: 18  Temp: 98.7 F (37.1 C)  TempSrc: Oral  SpO2: 100%   General appearance: AOx3 in no acute distress; appears fatigued HEENT: NCAT.  Oropharynx clear.  Lungs: clear to auscultation bilaterally without adventitious breath sounds  Heart: regular rate and rhythm.  Radial pulses 2+ symmetrical bilaterally Abdomen: soft; non-distended; nontender to light or deep palpation; bowel sounds present; no masses or organomegaly; no guarding Back:+ RT sided CVA tenderness Extremities: no edema; symmetrical with no gross deformities Skin: warm and dry Neurologic: Ambulates without difficulty Psychological: alert and cooperative; normal mood and affect  Labs Reviewed  POCT URINALYSIS DIP (DEVICE) - Abnormal; Notable for the following components:      Result Value   Ketones, ur TRACE (*)    Hgb urine dipstick TRACE (*)    Nitrite POSITIVE (*)    Leukocytes, UA SMALL (*)    All other components within normal limits  URINE CULTURE    ASSESSMENT & PLAN:  1. Acute cystitis with hematuria   2. Flank pain      Meds ordered this encounter  Medications  . cephALEXin (KEFLEX) 500 MG capsule    Sig: Take 1 capsule (500 mg total) by mouth 2 (two) times daily for 10 days.    Dispense:  20 capsule    Refill:  0    Order Specific Question:   Supervising Provider    Answer:   Isa Rankin [[id number]]    Urine culture sent.  We will call you with the results.   Push fluids and get plenty of rest.   Take antibiotic as directed and to completion Continue OTC medications as needed for symptomatic relief Follow up with PCP if symptoms persists Return here or go to ER if you have any new or worsening symptoms such as fever, worsening abdominal pain, nausea/vomiting, flank pain, etc...  Outlined signs and symptoms indicating need for more acute intervention. Patient verbalized understanding. After Visit Summary given.     Rennis Harding, PA-C 06/28/18 1306

## 2018-06-30 ENCOUNTER — Telehealth (HOSPITAL_COMMUNITY): Payer: Self-pay

## 2018-06-30 LAB — URINE CULTURE

## 2018-06-30 MED ORDER — NITROFURANTOIN MONOHYD MACRO 100 MG PO CAPS: 100.0000 mg | ORAL_CAPSULE | Freq: Two times a day (BID) | ORAL | 0 refills | Status: DC

## 2018-06-30 NOTE — Telephone Encounter (Signed)
Urine culture positive for E.coli. Resistant to Keflex given at urgent care visit. Rx for Macrobid 100 mg BID x 5 days sent to pharmacy of choice. Attempted to reach patient. No answer at this time. Voicemail left.

## 2018-07-01 ENCOUNTER — Telehealth (HOSPITAL_COMMUNITY): Payer: Self-pay

## 2018-07-01 NOTE — Telephone Encounter (Signed)
Pt called back and is aware of results and new rx

## 2018-07-09 ENCOUNTER — Ambulatory Visit: Payer: Self-pay | Admitting: Physician Assistant

## 2018-07-09 NOTE — Progress Notes (Deleted)
    07/09/2018 at 6:37 AM  Crystal Gray / DOB: 1981/07/24 / MRN: [id number]  The patient has Stress fracture of tibia and Left tibia and fibula fracture at the junction of middle and distal thirds. on their problem list.  SUBJECTIVE  Crystal Gray is a 37 y.o. female who complains of {UTI Symptoms:210800002} x *** days. She denies {UTI Symptoms:210800002}. Has tried *** with no relief. Most recent UTI prior to this was ***.   She  has a past medical history of Depression.    Medications reviewed and updated by myself where necessary, and exist elsewhere in the encounter.   Crystal Gray has No Known Allergies. She  reports that she has never smoked. She has never used smokeless tobacco. She reports that she drinks alcohol. She reports that she does not use drugs. She  has no sexual activity history on file. The patient  has a past surgical history that includes Wisdom tooth extraction; Tibia IM nail insertion (Left, 07/13/2013); and Fracture surgery.  Her family history includes Cancer in her maternal grandfather; Heart disease in her maternal grandfather.  ROS  OBJECTIVE  Her  vitals were not taken for this visit.  The patient's body mass index is unknown because there is no height or weight on file.  Physical Exam  No results found for this or any previous visit (from the past 24 hour(s)).  ASSESSMENT & PLAN  There are no diagnoses linked to this encounter.  Hx, UA, and urine micro suggestive of UTI. Will treat empirically at this time. Urine cx pending. The patient was advised to call or come back to clinic if she does not see an improvement in symptoms, or worsens with the above plan.   Benjiman CoreBrittany Bright Spielmann, PA-C  [facility] at [facility] 07/09/2018 6:37 AM

## 2018-07-10 ENCOUNTER — Ambulatory Visit (INDEPENDENT_AMBULATORY_CARE_PROVIDER_SITE_OTHER): Payer: 59 | Admitting: Emergency Medicine

## 2018-07-10 ENCOUNTER — Encounter: Payer: Self-pay | Admitting: Emergency Medicine

## 2018-07-10 ENCOUNTER — Other Ambulatory Visit: Payer: Self-pay

## 2018-07-10 VITALS — BP 114/70 | HR 63 | Temp 97.9°F | Resp 16 | Ht 68.5 in | Wt 168.4 lb

## 2018-07-10 DIAGNOSIS — R300 Dysuria: Secondary | ICD-10-CM

## 2018-07-10 DIAGNOSIS — N390 Urinary tract infection, site not specified: Secondary | ICD-10-CM | POA: Diagnosis not present

## 2018-07-10 DIAGNOSIS — R350 Frequency of micturition: Secondary | ICD-10-CM | POA: Diagnosis not present

## 2018-07-10 LAB — POCT URINALYSIS DIP (MANUAL ENTRY)
Bilirubin, UA: NEGATIVE
Glucose, UA: NEGATIVE mg/dL
Ketones, POC UA: NEGATIVE mg/dL
NITRITE UA: NEGATIVE
PROTEIN UA: NEGATIVE mg/dL
RBC UA: NEGATIVE
SPEC GRAV UA: 1.015 (ref 1.010–1.025)
UROBILINOGEN UA: 0.2 U/dL
pH, UA: 7 (ref 5.0–8.0)

## 2018-07-10 LAB — POC MICROSCOPIC URINALYSIS (UMFC): MUCUS RE: ABSENT

## 2018-07-10 MED ORDER — SULFAMETHOXAZOLE-TRIMETHOPRIM 800-160 MG PO TABS: 1.0000 | ORAL_TABLET | Freq: Two times a day (BID) | ORAL | 0 refills | Status: AC

## 2018-07-10 NOTE — Patient Instructions (Addendum)
     IF you received an x-ray today, you will receive an invoice from [facility]. Please contact [facility] at [phone] with questions or concerns regarding your invoice.   IF you received labwork today, you will receive an invoice from [facility]. Please contact [facility] at [phone] with questions or concerns regarding your invoice.   Our billing staff will not be able to assist you with questions regarding bills from these companies.  You will be contacted with the lab results as soon as they are available. The fastest way to get your results is to activate your My Chart account. Instructions are located on the last page of this paperwork. If you have not heard from us regarding the results in 2 weeks, please contact this office.     Urinary Tract Infection, Adult A urinary tract infection (UTI) is an infection of any part of the urinary tract. The urinary tract includes the:  Kidneys.  Ureters.  Bladder.  Urethra.  These organs make, store, and get rid of pee (urine) in the body. Follow these instructions at home:  Take over-the-counter and prescription medicines only as told by your doctor.  If you were prescribed an antibiotic medicine, take it as told by your doctor. Do not stop taking the antibiotic even if you start to feel better.  Avoid the following drinks: ? Alcohol. ? Caffeine. ? Tea. ? Carbonated drinks.  Drink enough fluid to keep your pee clear or pale yellow.  Keep all follow-up visits as told by your doctor. This is important.  Make sure to: ? Empty your bladder often and completely. Do not to hold pee for long periods of time. ? Empty your bladder before and after sex. ? Wipe from front to back after a bowel movement if you are female. Use each tissue one time when you wipe. Contact a doctor if:  You have back pain.  You have a fever.  You feel sick to your stomach (nauseous).  You throw up (vomit).  Your symptoms do  not get better after 3 days.  Your symptoms go away and then come back. Get help right away if:  You have very bad back pain.  You have very bad lower belly (abdominal) pain.  You are throwing up and cannot keep down any medicines or water. This information is not intended to replace advice given to you by your health care provider. Make sure you discuss any questions you have with your health care provider. Document Released: 05/28/2008 Document Revised: 05/17/2016 Document Reviewed: 10/31/2015 Elsevier Interactive Patient Education  2018 [employer]  

## 2018-07-10 NOTE — Progress Notes (Signed)
Crystal Gray 37 y.o.   Chief Complaint  Patient presents with  . urine    cloudy, odor, irritiation x 3-4 days    HISTORY OF PRESENT ILLNESS: This is a 37 y.o. female complaining of persistent UTI symptoms.  Just finished a course of Macrobid.  Last dose was 4 days ago and symptoms came back 2 days later.  Recent urine culture shows the following:  Component 12d ago  Specimen Description URINE, CLEAN CATCH   Special Requests NONE   Culture Abnormal   >=100,000 COLONIES/mL ESCHERICHIA COLI  Confirmed Extended Spectrum Beta-Lactamase Producer (ESBL). In bloodstream infections from ESBL organisms, carbapenems are preferred over piperacillin/tazobactam. They are shown to have a lower risk of mortality.  Performed at [facility], [address], [place], [place] [address]      Report Status 06/30/2018 FINAL   Organism ID, Bacteria ESCHERICHIA COLIAbnormal    Resulting Agency [facility]  Susceptibility    Escherichia coli    MIC    AMPICILLIN >=32 RESIST... Resistant    AMPICILLIN/SULBACTAM >=32 RESIST... Resistant    CEFAZOLIN >=64 RESIST... Resistant    CEFTRIAXONE >=64 RESIST... Resistant    CIPROFLOXACIN <=0.25 SENS... Sensitive    Extended ESBL POSITIVE  Resistant    GENTAMICIN >=16 RESIST... Resistant    IMIPENEM <=0.25 SENS... Sensitive    NITROFURANTOIN <=16 SENSIT... Sensitive    PIP/TAZO <=4 SENSITIVE  Sensitive    TRIMETH/SULFA <=20 SENSIT... Sensitive         Susceptibility Comments   Escherichia coli  >=100,000 COLONIES/mL ESCHERICHIA COLI      Specimen Collected: 06/28/18 13:00 Last Resulted: 06/30/18 07:59     Denies fever or chills.  Denies flank pain.  Eating and drinking well.  Denies nausea or vomiting.  States she gets 4-5 UTIs per year. HPI   Prior to Admission medications   Medication Sig Start Date End Date Taking? Authorizing Provider  norethindrone (MICRONOR,CAMILA,ERRIN) 0.35 MG tablet Take 1 tablet by mouth daily.   Yes  [provider]    No Known Allergies  Patient Active Problem List   Diagnosis Date Noted  . Left tibia and fibula fracture at the junction of middle and distal thirds. 07/13/2013  . Stress fracture of tibia 05/07/2012    Past Medical History:  Diagnosis Date  . Depression     Past Surgical History:  Procedure Laterality Date  . FRACTURE SURGERY    . TIBIA IM NAIL INSERTION Left 07/13/2013   Procedure: INTRAMEDULLARY (IM) NAIL TIBIAL;  Surgeon: Nestor Lewandowsky, MD;  Location: [facility] OR;  Service: Orthopedics;  Laterality: Left;  . WISDOM TOOTH EXTRACTION      Social History   Socioeconomic History  . Marital status: Married    Spouse name: Not on file  . Number of children: Not on file  . Years of education: Not on file  . Highest education level: Not on file  Occupational History  . Not on file  Social Needs  . Financial resource strain: Not on file  . Food insecurity:    Worry: Not on file    Inability: Not on file  . Transportation needs:    Medical: Not on file    Non-medical: Not on file  Tobacco Use  . Smoking status: Never Smoker  . Smokeless tobacco: Never Used  Substance and Sexual Activity  . Alcohol use: Yes    Comment: occasionally  . Drug use: No  . Sexual activity: Not on file  Lifestyle  . Physical activity:    Days per week: Not on file    Minutes per session: Not on file  . Stress: Not on file  Relationships  . Social connections:    Talks on phone: Not on file    Gets together: Not on file    Attends religious service: Not on file    Active member of club or organization: Not on file    Attends meetings of clubs or organizations: Not on file    Relationship status: Not on file  . Intimate partner violence:    Fear of current or ex partner: Not on file    Emotionally abused: Not on file    Physically abused: Not on file    Forced sexual activity: Not on file  Other Topics Concern  . Not on file  Social History Narrative  . Not  on file    Family History  Problem Relation Age of Onset  . Cancer Maternal Grandfather   . Heart disease Maternal Grandfather      Review of Systems  Constitutional: Negative.  Negative for chills, fever and malaise/fatigue.  HENT: Negative.  Negative for congestion, nosebleeds and sore throat.   Eyes: Negative.  Negative for discharge and redness.  Respiratory: Negative.  Negative for cough and shortness of breath.   Cardiovascular: Negative.  Negative for chest pain and palpitations.  Gastrointestinal: Negative.  Negative for abdominal pain, diarrhea, nausea and vomiting.  Genitourinary: Positive for dysuria, frequency and urgency. Negative for flank pain and hematuria.  Musculoskeletal: Negative.  Negative for back pain, myalgias and neck pain.  Skin: Negative.  Negative for rash.  Neurological: Negative.  Negative for dizziness and headaches.  Endo/Heme/Allergies: Negative.   All other systems reviewed and are negative.  Vitals:   07/10/18 0952  BP: 114/70  Pulse: 63  Resp: 16  Temp: 97.9 F (36.6 C)  SpO2: 100%     Physical Exam  Constitutional: She is oriented to person, place, and time. She appears well-developed and well-nourished.  HENT:  Head: Normocephalic and atraumatic.  Eyes: Pupils are equal, round, and reactive to light. EOM are normal.  Neck: Normal range of motion. Neck supple.  Cardiovascular: Normal rate.  Pulmonary/Chest: Effort normal.  Musculoskeletal: Normal range of motion.  Neurological: She is alert and oriented to person, place, and time.  Skin: Skin is warm and dry. Capillary refill takes less than 2 seconds.  Psychiatric: She has a normal mood and affect. Her behavior is normal.  Vitals reviewed.    Results for orders placed or performed in visit on 07/10/18 (from the past 24 hour(s))  POCT urinalysis dipstick     Status: Abnormal   Collection Time: 07/10/18 10:09 AM  Result Value Ref Range   Color, UA yellow yellow   Clarity, UA  hazy (A) clear   Glucose, UA negative negative mg/dL   Bilirubin, UA negative negative   Ketones, POC UA negative negative mg/dL   Spec Grav, UA [id number] [id number] - 1.025   Blood, UA negative negative   pH, UA 7.0 5.0 - 8.0   Protein Ur, POC negative negative mg/dL   Urobilinogen, UA 0.2 0.2 or 1.0 E.U./dL   Nitrite, UA Negative Negative   Leukocytes, UA Trace (A) Negative  POCT Microscopic Urinalysis ([facility])     Status: Abnormal   Collection Time: 07/10/18 10:20 AM  Result Value Ref Range   WBC,UR,HPF,POC Few (A) None WBC/hpf   RBC,UR,HPF,POC None None RBC/hpf   Bacteria  Many (A) None, Too numerous to count   Mucus Absent Absent   Epithelial Cells, UR Per Microscopy None None, Too numerous to count cells/hpf    A total of 25 minutes was spent in the room with the patient, greater than 50% of which was in counseling/coordination of care regarding diagnosis, treatment, medications, need for follow-up if no better or worse.   ASSESSMENT & PLAN:  Camber was seen today for urine.  Diagnoses and all orders for this visit:  Acute UTI Comments: Partially treated Orders: -     sulfamethoxazole-trimethoprim (BACTRIM DS,SEPTRA DS) 800-160 MG tablet; Take 1 tablet by mouth 2 (two) times daily for 7 days.  Urinary frequency -     POCT urinalysis dipstick -     POCT Microscopic Urinalysis ([facility]) -     Urine Culture  Dysuria    Patient Instructions       IF you received an x-ray today, you will receive an invoice from [facility]. Please contact [facility] at [phone] with questions or concerns regarding your invoice.   IF you received labwork today, you will receive an invoice from [facility]. Please contact [facility] at [phone] with questions or concerns regarding your invoice.   Our billing staff will not be able to assist you with questions regarding bills from these companies.  You will be contacted with the lab results as soon as they are  available. The fastest way to get your results is to activate your My Chart account. Instructions are located on the last page of this paperwork. If you have not heard from [place] regarding the results in 2 weeks, please contact this office.     Urinary Tract Infection, Adult A urinary tract infection (UTI) is an infection of any part of the urinary tract. The urinary tract includes the:  Kidneys.  Ureters.  Bladder.  Urethra.  These organs make, store, and get rid of pee (urine) in the body. Follow these instructions at home:  Take over-the-counter and prescription medicines only as told by your doctor.  If you were prescribed an antibiotic medicine, take it as told by your doctor. Do not stop taking the antibiotic even if you start to feel better.  Avoid the following drinks: ? Alcohol. ? Caffeine. ? Tea. ? Carbonated drinks.  Drink enough fluid to keep your pee clear or pale yellow.  Keep all follow-up visits as told by your doctor. This is important.  Make sure to: ? Empty your bladder often and completely. Do not to hold pee for long periods of time. ? Empty your bladder before and after sex. ? Wipe from front to back after a bowel movement if you are female. Use each tissue one time when you wipe. Contact a doctor if:  You have back pain.  You have a fever.  You feel sick to your stomach (nauseous).  You throw up (vomit).  Your symptoms do not get better after 3 days.  Your symptoms go away and then come back. Get help right away if:  You have very bad back pain.  You have very bad lower belly (abdominal) pain.  You are throwing up and cannot keep down any medicines or water. This information is not intended to replace advice given to you by your health care provider. Make sure you discuss any questions you have with your health care provider. Document Released: 05/28/2008 Document Revised: 05/17/2016 Document Reviewed: 10/31/2015 Elsevier Interactive  Patient Education  2018 [employer].    [place]  Mitchel Honour, MD [facility]

## 2018-07-12 LAB — URINE CULTURE

## 2018-07-14 ENCOUNTER — Encounter: Payer: Self-pay | Admitting: Emergency Medicine

## 2018-07-23 DIAGNOSIS — N762 Acute vulvitis: Secondary | ICD-10-CM | POA: Diagnosis not present

## 2018-09-14 DIAGNOSIS — N390 Urinary tract infection, site not specified: Secondary | ICD-10-CM | POA: Diagnosis not present

## 2018-09-16 DIAGNOSIS — Z23 Encounter for immunization: Secondary | ICD-10-CM | POA: Diagnosis not present

## 2018-11-04 ENCOUNTER — Ambulatory Visit (INDEPENDENT_AMBULATORY_CARE_PROVIDER_SITE_OTHER): Payer: 59 | Admitting: Sports Medicine

## 2018-11-04 ENCOUNTER — Encounter: Payer: Self-pay | Admitting: Sports Medicine

## 2018-11-04 ENCOUNTER — Ambulatory Visit: Payer: Self-pay

## 2018-11-04 VITALS — BP 112/76 | Ht 68.0 in | Wt 160.0 lb

## 2018-11-04 DIAGNOSIS — M25571 Pain in right ankle and joints of right foot: Secondary | ICD-10-CM | POA: Diagnosis not present

## 2018-11-04 NOTE — Progress Notes (Signed)
   HPI  CC: R ankle pain  Patient states she has had R diffuse ankle pain for the past couple of weeks, also with some R medial shin pain and achilles pain. Feels dull, though does have an area of tenderness on her medial lower shin area. Her ankle/shin pain is reminiscent of a stress fracture she had in the past.  No acute traumatic event or rolling of the ankle. No popping or swelling. Seems to worsen after working out as it will typically hurt for a couple of day after and gets better with rest. She states that she is typically very active with roller derby, some light running on treadmill and orange theory. However was doing high intensity runs on TM before this started.  About 1 month ago her shoes were wearing out so got some new training shoes at [employer] with stiff bottoms that seemed to make her ankle pain worse.   Past Injuries: L tib/fib fracture from a roller derby injury. L shin stress fracture when she was postpartum from twins 7 years ago.   ROS: Per HPI; in addition no fever, no rash, no additional weakness, no additional numbness, no additional paresthesias, and no additional falls/injury.   Objective: BP 112/76   Ht 5\' 8"  (1.727 m)   Wt 160 lb (72.6 kg)   BMI 24.33 kg/m  Gen: NAD, well groomed, a/o x3, normal affect.  CV: Well-perfused. Warm.  Resp: Non-labored.  Neuro: Sensation intact throughout. No gross coordination deficits.  MSK:  R foot 5th MTP bunionette with subluxation.toe; R ankle without ligamentous laxity and full ROM and strength. Positive hop test on R. TTP over R medial shin along tibia. L foot hallux ridigitis. Normal walking gait.  MSK ultrasound Right tibia and ankle L tibia with increased cortical blood flow and slight adjacent edema to cortex without disruption of surface.  No edema around posterior tibialis or flexor hallucis longus tendons.  Tendons intact No ankle effusion  Impression: stress reaction and possible stress fracture Right distal  tibia  Ultrasound and interpretation by Sibyl ParrKarl B. Emmalee Solivan, MD   Assessment and Plan:  Pain in joint involving right ankle and foot New early stress reaction that likely is the beginnings of a stress fracture. Patient put in short air cast today. Advised cross training and avoidance of impact. Recommended heel raises and minisquats. Patient to start daily calcium-vit D 1200mg -800IU supplementation.  Follow up in 6 weeks. Relative rest OK to cross train in pool or on bike  Orders Placed This Encounter  Procedures  . US LIMITED JOINT SPACE STRUCTURES LOW RIGHT    Standing Status:   Future    Number of Occurrences:   1    Standing Expiration Date:   01/05/2020    Order Specific Question:   Reason for Exam (SYMPTOM  OR DIAGNOSIS REQUIRED)    Answer:   right ankle/foot pain    Order Specific Question:   Preferred imaging location?    Answer:   Internal    Leland HerElsia J Yoo, DO PGY-3, [facility] 11/04/2018 3:54 PM   I observed and examined the patient with the resident and agree with assessment and plan.  Note reviewed and modified by me. Vedia CofferKB Quintessa Simmerman,MD

## 2018-11-04 NOTE — Assessment & Plan Note (Signed)
New early stress reaction that likely is the beginnings of a stress fracture. Patient put in short air cast today. Advised cross training and avoidance of impact. Recommended heel raises and minisquats. Patient to start daily calcium-vit D 1200mg -800IU supplementation.  Follow up in 6 weeks.

## 2018-11-04 NOTE — Patient Instructions (Signed)
Today we discussed your right ankle pain. Please wear the aircast brace you were given today anytime you are up and walking around for the next 6 weeks. Ice for 15 minutes 3-4 times a day. You can walk for exercise and cross-train on a bike. Begin doing calf raises and mini squats daily, 3 sets of 15 reps. Start taking Calcium and Vitamin D 1200 and 800 IUs. See us for follow up in 6 weeks.

## 2018-11-05 ENCOUNTER — Telehealth: Payer: Self-pay

## 2018-11-05 ENCOUNTER — Ambulatory Visit: Payer: Self-pay | Admitting: *Deleted

## 2018-11-05 NOTE — Telephone Encounter (Signed)
Patient plays adult roller derby and was kicked in the head by another player's skate at a fast speed. She was wearing a helmet and did not receive any physical injury to the head. Occurred on 11/02/18. Since, she had experienced headaches, foggy-feeling, noise and light sensitivity. Denies vision changes/LOC/vomiting. Spoke with Vikki PortsValerie at concussion clinic. Informed patient the clinic would phone to schedule an appointment w/in the hour. She has an outpatient MRI scheduled for 4:30p today.  Reason for Disposition . Dangerous injury (e.g., MVA, diving, trampoline, contact sports, fall > 10 feet or 3 meters) or severe blow from hard object (e.g., golf club or baseball bat)  Answer Assessment - Initial Assessment Questions 1. MECHANISM: "How did the injury happen?" For falls, ask: "What height did you fall from?" and "What surface did you fall against?"     Plays Roller [place], head kicked by teammates skate.  2. ONSET: "When did the injury happen?" (Minutes or hours ago)      Sunday evening 3. NEUROLOGIC SYMPTOMS: "Was there any loss of consciousness?" "Are there any other neurological symptoms?"      No LOC. Headache, noise and light sensitivity. 4. MENTAL STATUS: "Does the person know who he is, who you are, and where he is?"      yes 5. LOCATION: "What part of the head was hit?"      Helmet was hit by a moving player's skate.  6. SCALP APPEARANCE: "What does the scalp look like? Is it bleeding now?" If so, ask: "Is it difficult to stop?"      No, nothing, she had helmet on. 7. SIZE: For cuts, bruises, or swelling, ask: "How large is it?" (e.g., inches or centimeters)      None. 8. PAIN: "Is there any pain?" If so, ask: "How bad is it?"  (e.g., Scale 1-10; or mild, moderate, severe)    Mild headache, taking tylenol. 9. TETANUS: For any breaks in the skin, ask: "When was the last tetanus booster?"     na 10. OTHER SYMPTOMS: "Do you have any other symptoms?" (e.g., neck pain, vomiting)  no 11. PREGNANCY: "Is there any chance you are pregnant?" "When was your last menstrual period?"       no  Protocols used: HEAD INJURY-A-AH

## 2018-11-05 NOTE — Progress Notes (Signed)
Subjective:   I, Wilford GristValerie Gray, am serving as a scribe for Dr. Antoine PrimasZachary Debralee Braaksma.   Chief Complaint: Crystal GreeningChristina Gray, DOB: [birth year], is a 37 y.o. female who presents for head injury sustained on 11/02/2018. She was playing rollerderby and was hit in the back of the head by another players skate. She was wearing a helmet. No LOC and no history of head injury. Denies any neck pain. Patient does feel as if she is improving since Sunday. Is having headaches and fogginess. Did have some light sensitivity. She works in front of a computer all day. Did try to go to work on Tuesday but had increasing symptoms so she has not returned.    Injury date :11/02/2018 Visit #: 1  Previous imagine.   History of Present Illness:    Concussion Self-Reported Symptom Score Symptoms rated on a scale 1-6, in last 24 hours  Headache:0   Nausea:0  Vomiting: 0  Balance Difficulty: 0  Dizziness: 0  Fatigue: 0  Trouble Falling Asleep: 3, last night only  Sleep More Than Usual: 5  Sleep Less Than Usual: 0  Daytime Drowsiness: 0  Photophobia: 2  Phonophobia: 1  Irritability: 2  Sadness: 0  Nervousness: 0  Feeling More Emotional: 0  Numbness or Tingling:0  Feeling Slowed Down: 2  Feeling Mentally Foggy: 2  Difficulty Concentrating: 1  Difficulty Remembering: 0  Visual Problems:0    Total Symptom Score: 18  Review of Systems: Pertinent items are noted in HPI.  Review of History: Past Medical History:  Past Medical History:  Diagnosis Date  . Depression     Past Surgical History:  has a past surgical history that includes Wisdom tooth extraction; Tibia IM nail insertion (Left, 07/13/2013); and Fracture surgery. Family History: family history includes Cancer in her maternal grandfather; Heart disease in her maternal grandfather. no family history of autoimmune Social History:  reports that she has never smoked. She has never used smokeless tobacco. She reports that she drinks alcohol. She  reports that she does not use drugs. Current Medications: has a current medication list which includes the following prescription(s): norethindrone. Allergies: has No Known Allergies.  Objective:    Physical Examination Vitals:   11/06/18 0837  BP: 126/82  Pulse: 70  SpO2: 98%   General: No apparent distress alert and oriented x3 mood and affect normal, dressed appropriately.  HEENT: Pupils equal, extraocular movements intact  Respiratory: Patient's speak in full sentences and does not appear short of breath  Cardiovascular: No lower extremity edema, non tender, no erythema  Skin: Warm dry intact with no signs of infection or rash on extremities or on axial skeleton.  Abdomen: Soft nontender  Neuro: Cranial nerves II through XII are intact, neurovascularly intact in all extremities with 2+ DTRs and 2+ pulses.  Lymph: No lymphadenopathy of posterior or anterior cervical chain or axillae bilaterally.  Gait normal with good balance and coordination.  MSK:  Non tender with full range of motion and good stability and symmetric strength and tone of shoulders, elbows, wrist,  knee and ankles bilaterally.  Psychiatric: Oriented X3, intact recent and remote memory, judgement and insight, normal mood and affect  Concussion testing performed today:  I spent 41 minutes with patient discussing test and results including review of history and patient chart and  integration of patient data, interpretation of standardized test results and clinical data, clinical decision making, treatment planning and report,and interactive feedback to the patient with all of patients questions answered.  Neurocognitive testing (ImPACT):   Post #1:    Verbal Memory Composite  94 (84%)   Visual Memory Composite  80 (85%)   Visual Motor Speed Composite  42.65 (77%)   Reaction Time Composite  .71 (19%)   Cognitive Efficiency Index  .35    Vestibular Screening:       Headache  Dizziness  Smooth Pursuits n n   H. Saccades n n  V. Saccades n n  H. VOR n n  V. VOR n n  Visual Motor Sensitivity n n      Convergence: 3 cm  n n   Balance Screen: 30/30   Patient did have an MRI that was brought today.  MRI independently visualized by me showing no significant abnormality except for some calcific changes of the pineal gland    Assessment:    No diagnosis found.  Treonna Klee presents with resolved concussion    I was personally involved with the physical evaluation of and am in agreement with the assessment and treatment plan for this patient.  Greater than 50% of this encounter was spent in direct consultation with the patient in evaluation, counseling, and coordination of care. Duration of encounter: 61 minutes.  After Visit Summary printed out and provided to patient as appropriate.

## 2018-11-05 NOTE — Telephone Encounter (Signed)
Patient plays rollerderby and she was hit in the head with another players skate. No LOC or history of head injury. She has been having headaches, photophobia, and fogginess. Has tried to do some work which is 100% on the computer. Recommended to not be active today and to go into ED if her symptoms become unmanageable from today until her appointment tomorrow morning. Patient voices understanding.

## 2018-11-06 ENCOUNTER — Ambulatory Visit (INDEPENDENT_AMBULATORY_CARE_PROVIDER_SITE_OTHER): Payer: 59 | Admitting: Family Medicine

## 2018-11-06 ENCOUNTER — Encounter: Payer: Self-pay | Admitting: Family Medicine

## 2018-11-06 DIAGNOSIS — S060X0A Concussion without loss of consciousness, initial encounter: Secondary | ICD-10-CM | POA: Diagnosis not present

## 2018-11-06 DIAGNOSIS — S060XAA Concussion with loss of consciousness status unknown, initial encounter: Secondary | ICD-10-CM | POA: Insufficient documentation

## 2018-11-06 DIAGNOSIS — S060X9A Concussion with loss of consciousness of unspecified duration, initial encounter: Secondary | ICD-10-CM | POA: Insufficient documentation

## 2018-11-06 NOTE — Patient Instructions (Signed)
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 See me again next week if not perfect

## 2018-11-06 NOTE — Assessment & Plan Note (Signed)
Patient's initial injury and symptoms at the time seem to be more of a mild concussion that I think is now completely resolved at this time.  Patient given a return to sport progression, over-the-counter natural supplementations that could be beneficial.  I believe that patient will have no long-term residual problems.  Given note for work to do a part-time on Friday and then full-time trial for 1 week.  Worsening symptoms patient will come back and see me in a week otherwise can follow-up as needed

## 2018-11-13 ENCOUNTER — Ambulatory Visit: Payer: 59 | Admitting: Family Medicine

## 2018-11-18 ENCOUNTER — Ambulatory Visit: Payer: 59 | Admitting: Sports Medicine

## 2018-12-15 ENCOUNTER — Ambulatory Visit (INDEPENDENT_AMBULATORY_CARE_PROVIDER_SITE_OTHER): Payer: 59 | Admitting: Sports Medicine

## 2018-12-15 ENCOUNTER — Encounter: Payer: Self-pay | Admitting: Sports Medicine

## 2018-12-15 DIAGNOSIS — M8430XA Stress fracture, unspecified site, initial encounter for fracture: Secondary | ICD-10-CM | POA: Diagnosis not present

## 2018-12-15 NOTE — Patient Instructions (Addendum)
It is great to see you today for follow-up of your stress reaction in the bone on your right tibia.  We are recommending a return to run protocol.  Please see the handout that we gave you for this.  This should be a progressive increase in the amount of time/distance you are running.  Please do not increase more than 10% in 1 week.  If your symptoms recur, we are recommending considering custom orthotics.  You may bring in your new running shoes at any point to have a repeat gait analysis/custom orthotics made.

## 2018-12-15 NOTE — Progress Notes (Signed)
  Crystal GreeningChristina Gray - 37 y.o. female MRN [id number]  Date of birth: 07-11-1981   Chief complaint: Stress reaction of bone follow-up of the right medial tibia  SUBJECTIVE:    History of present illness: 37 year old female who is a former [employer] now retired who presents today for follow-up of right ankle and lower leg pain.  She had an ultrasound performed at that time which was concerning for a stress reaction in the bone although no true stress fracture was visualized.  This is her second stress injury with her other one being in her left ankle several years ago.  She reports after a period of 6 weeks of rest from running on the treadmill, she is now pain-free.  She is wearing an ASO lace up brace which is helping with mobility.  She would like to get back into exercise.  She denies any new injuries or new complaints.  Denies any numbness or tingling of the extremity.  Denies any knee pain, hip pain, or foot pain associated with her current problems.   Review of systems:  As stated above   Interval past medical history, surgical history, family history, and social history obtained and are unchanged.   Of note, she has a history of a stress fracture in her left medial tibia several years ago.  No family history of bone or joint diseases.  She is a non-smoker.  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  Medications: None Allergies: None  OBJECTIVE:  Physical exam: Vital signs are reviewed. BP 122/72   Ht 5\' 8"  (1.727 m)   Wt 160 lb (72.6 kg)   BMI 24.33 kg/m   Gen.: Alert, oriented, appears stated age, in no apparent distress Integumentary: No rashes or ecchymoses Neurologic: nonfocal Gait: normal without associated limp, and attempt at gait analysis was performed today however the patient was wearing moccasins.  She has significant mobility at her tibias in terms of rotation with her gait.  She also has pronation with running.  Would like a more formal assessment  of this with her running shoes. Musculoskeletal: Inspection of her right ankle demonstrates no abnormalities.  She has no tenderness to palpitation over her medial tibia.  She has full range of motion ankle dorsiflexion plantarflexion.  Strength testing is 5 out of 5 in ankle dorsiflexion plantarflexion.  Negative anterior drawer the ankle.  Negative ankle inversion eversion test.  Negative tibia/fibular squeeze test.  Negative calcaneal squeeze.  She is able to do a 1 leg hop 5 times on her right leg without any significant problems.    ASSESSMENT & PLAN: Stress reaction of bone -Follow-up visit from medial tibial stress reaction of her right ankle -Currently asymptomatic.  Underwent a period of rest for 6 weeks. -Recommend return to running protocol. Handout given on this. -Recommend pursuing a new pair of running shoes.  If you would like, once purchasing your running shoes, you may schedule a follow-up visit to have a gait analysis and or consideration for orthotics given your history of 2 stress fractures/reactions; 1 in each ankle.    Gustavus MessingAJ Pinney, DO Sports Medicine Fellow [facility]  I observed and examined the patient with the [facility] and agree with assessment and plan.  Note reviewed and modified by me. Sterling BigKB Fields, MD

## 2018-12-15 NOTE — Assessment & Plan Note (Signed)
-  Follow-up visit from medial tibial stress reaction of her right ankle -Currently asymptomatic.  Underwent a period of rest for 6 weeks. -Recommend return to running protocol with starting off at 1/2 to 1 mile on the treadmill at a standard pace.  He may do this 2-3 times per week and increase your distance by 10% weekly to get you back to the distance you are running prior to your injury. -Recommend pursuing a new pair of running shoes.  If you would like, once purchasing your running shoes, you may schedule a follow-up visit to have a gait analysis and or consideration for orthotics given your history of 2 stress fractures/reactions; 1 in each ankle.

## 2018-12-16 DIAGNOSIS — N390 Urinary tract infection, site not specified: Secondary | ICD-10-CM | POA: Diagnosis not present
